# Patient Record
Sex: Female | Born: 1975 | Race: White | Hispanic: No | Smoking: Former smoker
Health system: Southern US, Community
[De-identification: ages and names within clinical notes are randomized; demographics above are authoritative.]

## PROBLEM LIST (undated history)

## (undated) DIAGNOSIS — N390 Urinary tract infection, site not specified: Secondary | ICD-10-CM

## (undated) DIAGNOSIS — K8020 Calculus of gallbladder without cholecystitis without obstruction: Secondary | ICD-10-CM

## (undated) DIAGNOSIS — F419 Anxiety disorder, unspecified: Secondary | ICD-10-CM

## (undated) DIAGNOSIS — I493 Ventricular premature depolarization: Secondary | ICD-10-CM

## (undated) DIAGNOSIS — E119 Type 2 diabetes mellitus without complications: Secondary | ICD-10-CM

## (undated) DIAGNOSIS — B019 Varicella without complication: Secondary | ICD-10-CM

## (undated) DIAGNOSIS — M47814 Spondylosis without myelopathy or radiculopathy, thoracic region: Secondary | ICD-10-CM

## (undated) DIAGNOSIS — F1010 Alcohol abuse, uncomplicated: Secondary | ICD-10-CM

## (undated) DIAGNOSIS — D734 Cyst of spleen: Secondary | ICD-10-CM

## (undated) DIAGNOSIS — F17200 Nicotine dependence, unspecified, uncomplicated: Secondary | ICD-10-CM

## (undated) HISTORY — DX: Spondylosis without myelopathy or radiculopathy, thoracic region: M47.814

## (undated) HISTORY — DX: Nicotine dependence, unspecified, uncomplicated: F17.200

## (undated) HISTORY — DX: Alcohol abuse, uncomplicated: F10.10

## (undated) HISTORY — DX: Urinary tract infection, site not specified: N39.0

## (undated) HISTORY — DX: Anxiety disorder, unspecified: F41.9

## (undated) HISTORY — DX: Type 2 diabetes mellitus without complications: E11.9

## (undated) HISTORY — PX: SKIN SURGERY: SHX2413

## (undated) HISTORY — DX: Varicella without complication: B01.9

## (undated) HISTORY — PX: APPENDECTOMY: SHX54

## (undated) HISTORY — PX: OTHER SURGICAL HISTORY: SHX169

## (undated) HISTORY — DX: Ventricular premature depolarization: I49.3

## (undated) HISTORY — DX: Calculus of gallbladder without cholecystitis without obstruction: K80.20

## (undated) HISTORY — DX: Cyst of spleen: D73.4

---

## 2011-10-03 ENCOUNTER — Emergency Department: Payer: Self-pay | Admitting: Emergency Medicine

## 2011-10-03 LAB — BASIC METABOLIC PANEL
Anion Gap: 10 (ref 7–16)
BUN: 7 mg/dL (ref 7–18)
Chloride: 105 mmol/L (ref 98–107)
Co2: 27 mmol/L (ref 21–32)
Creatinine: 0.61 mg/dL (ref 0.60–1.30)
EGFR (Non-African Amer.): >60
Osmolality: 280 (ref 275–301)
Sodium: 142 mmol/L (ref 136–145)

## 2011-10-03 LAB — CBC WITH DIFFERENTIAL/PLATELET
Basophil #: 0 10*3/uL (ref 0.0–0.1)
Basophil %: 0.1 %
Eosinophil #: 0.1 10*3/uL (ref 0.0–0.7)
Eosinophil %: 0.5 %
HCT: 41.5 % (ref 35.0–47.0)
HGB: 14 g/dL (ref 12.0–16.0)
Lymphocyte #: 2.1 10*3/uL (ref 1.0–3.6)
Lymphocyte %: 15.4 %
MCH: 32 pg (ref 26.0–34.0)
Monocyte #: 0.9 10*3/uL — ABNORMAL HIGH (ref 0.0–0.7)
Neutrophil #: 10.6 10*3/uL — ABNORMAL HIGH (ref 1.4–6.5)
Neutrophil %: 77.5 %
RBC: 4.39 10*6/uL (ref 3.80–5.20)

## 2011-10-03 LAB — PREGNANCY, URINE: Pregnancy Test, Urine: NEGATIVE m[IU]/mL

## 2011-10-03 LAB — MONONUCLEOSIS SCREEN: Mono Test: NEGATIVE

## 2012-08-07 ENCOUNTER — Emergency Department: Payer: Self-pay | Admitting: Emergency Medicine

## 2012-08-07 LAB — CBC WITH DIFFERENTIAL/PLATELET
Basophil #: 0.1 10*3/uL (ref 0.0–0.1)
Eosinophil #: 0.2 10*3/uL (ref 0.0–0.7)
HCT: 36.4 % (ref 35.0–47.0)
Lymphocyte %: 13.1 %
MCHC: 33.8 g/dL (ref 32.0–36.0)
Neutrophil #: 10.3 10*3/uL — ABNORMAL HIGH (ref 1.4–6.5)
Neutrophil %: 77.9 %
Platelet: 239 10*3/uL (ref 150–440)
RDW: 13 % (ref 11.5–14.5)

## 2012-08-07 LAB — BASIC METABOLIC PANEL
Chloride: 109 mmol/L — ABNORMAL HIGH (ref 98–107)
Creatinine: 0.49 mg/dL — ABNORMAL LOW (ref 0.60–1.30)
EGFR (Non-African Amer.): >60
Glucose: 101 mg/dL — ABNORMAL HIGH (ref 65–99)
Osmolality: 276 (ref 275–301)
Potassium: 4.9 mmol/L (ref 3.5–5.1)
Sodium: 139 mmol/L (ref 136–145)

## 2012-08-07 LAB — HCG, QUANTITATIVE, PREGNANCY: Beta Hcg, Quant.: 2418 m[IU]/mL — ABNORMAL HIGH

## 2012-08-07 LAB — HEPATIC FUNCTION PANEL A (ARMC)
Albumin: 3.8 g/dL (ref 3.4–5.0)
Alkaline Phosphatase: 62 U/L (ref 50–136)
SGOT(AST): 41 U/L — ABNORMAL HIGH (ref 15–37)
SGPT (ALT): 28 U/L (ref 12–78)
Total Protein: 7.3 g/dL (ref 6.4–8.2)

## 2012-08-07 LAB — URINALYSIS, COMPLETE
Nitrite: NEGATIVE
Ph: 6 (ref 4.5–8.0)
Protein: NEGATIVE
Specific Gravity: 1.02 (ref 1.003–1.030)

## 2012-08-07 LAB — LIPASE, BLOOD: Lipase: 124 U/L (ref 73–393)

## 2014-08-17 LAB — HM PAP SMEAR: HM Pap smear: NEGATIVE

## 2014-09-26 ENCOUNTER — Emergency Department: Payer: Self-pay | Admitting: Emergency Medicine

## 2014-10-08 ENCOUNTER — Emergency Department: Payer: Self-pay | Admitting: Emergency Medicine

## 2014-10-24 DIAGNOSIS — F17200 Nicotine dependence, unspecified, uncomplicated: Secondary | ICD-10-CM | POA: Insufficient documentation

## 2014-10-24 DIAGNOSIS — Z8619 Personal history of other infectious and parasitic diseases: Secondary | ICD-10-CM | POA: Insufficient documentation

## 2014-11-28 DIAGNOSIS — F4329 Adjustment disorder with other symptoms: Secondary | ICD-10-CM | POA: Insufficient documentation

## 2015-02-20 DIAGNOSIS — F1911 Other psychoactive substance abuse, in remission: Secondary | ICD-10-CM | POA: Insufficient documentation

## 2015-02-20 DIAGNOSIS — O09292 Supervision of pregnancy with other poor reproductive or obstetric history, second trimester: Secondary | ICD-10-CM | POA: Insufficient documentation

## 2015-03-11 DIAGNOSIS — D649 Anemia, unspecified: Secondary | ICD-10-CM | POA: Insufficient documentation

## 2016-08-22 ENCOUNTER — Emergency Department: Payer: Managed Care, Other (non HMO)

## 2016-08-22 ENCOUNTER — Encounter: Payer: Self-pay | Admitting: Emergency Medicine

## 2016-08-22 ENCOUNTER — Observation Stay
Admission: EM | Admit: 2016-08-22 | Discharge: 2016-08-24 | Disposition: A | Discharge status: Home / Self Care | Payer: Managed Care, Other (non HMO) | Attending: Surgery | Admitting: Surgery

## 2016-08-22 DIAGNOSIS — K811 Chronic cholecystitis: Principal | ICD-10-CM | POA: Insufficient documentation

## 2016-08-22 DIAGNOSIS — R079 Chest pain, unspecified: Secondary | ICD-10-CM

## 2016-08-22 DIAGNOSIS — K819 Cholecystitis, unspecified: Secondary | ICD-10-CM

## 2016-08-22 DIAGNOSIS — R1013 Epigastric pain: Secondary | ICD-10-CM

## 2016-08-22 DIAGNOSIS — F17210 Nicotine dependence, cigarettes, uncomplicated: Secondary | ICD-10-CM | POA: Insufficient documentation

## 2016-08-22 LAB — CBC
HEMATOCRIT: 40.9 % (ref 35.0–47.0)
Hemoglobin: 14.1 g/dL (ref 12.0–16.0)
MCH: 32.4 pg (ref 26.0–34.0)
MCHC: 34.4 g/dL (ref 32.0–36.0)
MCV: 94.3 fL (ref 80.0–100.0)
Platelets: 211 10*3/uL (ref 150–440)
RBC: 4.34 MIL/uL (ref 3.80–5.20)
RDW: 12.7 % (ref 11.5–14.5)
WBC: 8.6 10*3/uL (ref 3.6–11.0)

## 2016-08-22 LAB — HEPATIC FUNCTION PANEL
ALBUMIN: 4.2 g/dL (ref 3.5–5.0)
ALK PHOS: 35 U/L — AB (ref 38–126)
ALT: 16 U/L (ref 14–54)
AST: 19 U/L (ref 15–41)
BILIRUBIN INDIRECT: 0.8 mg/dL (ref 0.3–0.9)
Bilirubin, Direct: 0.1 mg/dL (ref 0.1–0.5)
Total Bilirubin: 0.9 mg/dL (ref 0.3–1.2)
Total Protein: 6.9 g/dL (ref 6.5–8.1)

## 2016-08-22 LAB — BASIC METABOLIC PANEL
ANION GAP: 4 — AB (ref 5–15)
BUN: 16 mg/dL (ref 6–20)
CHLORIDE: 109 mmol/L (ref 101–111)
CO2: 25 mmol/L (ref 22–32)
Calcium: 8.9 mg/dL (ref 8.9–10.3)
Creatinine, Ser: 0.87 mg/dL (ref 0.44–1.00)
GFR calc Af Amer: >60 mL/min (ref >60)
Glucose, Bld: 97 mg/dL (ref 65–99)
POTASSIUM: 4.1 mmol/L (ref 3.5–5.1)
SODIUM: 138 mmol/L (ref 135–145)

## 2016-08-22 LAB — LIPASE, BLOOD: LIPASE: 60 U/L — AB (ref 11–51)

## 2016-08-22 LAB — TROPONIN I: Troponin I: <0.03 ng/mL (ref <0.03)

## 2016-08-22 MED ORDER — ONDANSETRON HCL 4 MG/2ML IJ SOLN
INTRAMUSCULAR | Status: AC
  Administered 2016-08-22: 4 mg
  Filled 2016-08-22: qty 2

## 2016-08-22 MED ORDER — SODIUM CHLORIDE 0.9 % IV BOLUS (SEPSIS)
1000.0000 mL | Freq: Once | INTRAVENOUS | Status: AC
  Administered 2016-08-22: 1000 mL via INTRAVENOUS

## 2016-08-22 MED ORDER — FAMOTIDINE IN NACL 20-0.9 MG/50ML-% IV SOLN
20.0000 mg | Freq: Once | INTRAVENOUS | Status: AC
  Administered 2016-08-22: 20 mg via INTRAVENOUS
  Filled 2016-08-22: qty 50

## 2016-08-22 MED ORDER — IOPAMIDOL (ISOVUE-370) INJECTION 76%
75.0000 mL | Freq: Once | INTRAVENOUS | Status: AC | PRN
Start: 2016-08-22 — End: 2016-08-22
  Administered 2016-08-22: 75 mL via INTRAVENOUS

## 2016-08-22 MED ORDER — MORPHINE SULFATE (PF) 4 MG/ML IV SOLN
INTRAVENOUS | Status: AC
  Administered 2016-08-22: 4 mg
  Filled 2016-08-22: qty 1

## 2016-08-22 NOTE — ED Triage Notes (Signed)
Pt says she was laying down with her son in the last hour and began having pain to the center of her chest radiating through to her back; constant pain, "like a tight";  nausea just started, no vomiting; pt denies history of similar pain; talking in complete coherent sentences;

## 2016-08-22 NOTE — ED Notes (Signed)
Pt taken to ct 

## 2016-08-22 NOTE — ED Provider Notes (Signed)
[facility] Emergency Department Provider Note   ____________________________________________   First MD Initiated Contact with Patient 08/22/16 2323     (approximate)  I have reviewed the triage vital signs and the nursing notes.   HISTORY  Chief Complaint Chest Pain and Nausea    HPI Erica Mejia is a 41 y.o. female who presents to the ED from home with a chief complaint of upper abdominal/lower chest pain. Patient reports sudden onset of substernal chest pain radiating to her back approximately 1 hour ago. Describes tight, constant pain associated with nausea only. Denies diaphoresis, shortness of breath, vomiting, palpitations or dizziness. Denies history of similar pain. Denies recent fever, chills, dysuria, diarrhea. Denies recent travel or trauma. Has IUD in place. Nothing makes her symptoms better or worse.   Past medical history None  There are no active problems to display for this patient.   Past Surgical History:  Procedure Laterality Date  . APPENDECTOMY      Prior to Admission medications   Medication Sig Start Date End Date Taking? Authorizing Provider  levonorgestrel (MIRENA) 20 MCG/24HR IUD 1 each by Intrauterine route once.   Yes Historical Provider, MD    Allergies Patient has no known allergies.  History reviewed. No pertinent family history.  Social History Social History  Substance Use Topics  . Smoking status: Current Every Day Smoker  . Smokeless tobacco: Never Used  . Alcohol use No    Review of Systems  Constitutional: No fever/chills. Eyes: No visual changes. ENT: No sore throat. Cardiovascular: Positive for chest pain. Respiratory: Denies shortness of breath. Gastrointestinal: Positive for abdominal pain.  Positive for nausea, no vomiting.  No diarrhea.  No constipation. Genitourinary: Negative for dysuria. Musculoskeletal: Negative for back pain. Skin: Negative for rash. Neurological: Negative for  headaches, focal weakness or numbness.  10-point ROS otherwise negative.  ____________________________________________   PHYSICAL EXAM:  VITAL SIGNS: ED Triage Vitals [08/22/16 2309]  Enc Vitals Group     BP      Pulse      Resp      Temp      Temp src      SpO2      Weight 175 lb (79.4 kg)     Height 5\' 5"  (1.651 m)     Head Circumference      Peak Flow      Pain Score 8     Pain Loc      Pain Edu?      Excl. in GC?     Constitutional: Alert and oriented. Well appearing and in moderate acute distress. Eyes: Conjunctivae are normal. PERRL. EOMI. Head: Atraumatic. Nose: No congestion/rhinnorhea. Mouth/Throat: Mucous membranes are moist.  Oropharynx non-erythematous. Neck: No stridor.   Cardiovascular: Normal rate, regular rhythm. Grossly normal heart sounds.  Good peripheral circulation. Respiratory: Normal respiratory effort.  No retractions. Lungs CTAB. Gastrointestinal: Soft and tender to palpation epigastrium with voluntary guarding, no rebound. No distention. No abdominal bruits. No CVA tenderness. Musculoskeletal: No lower extremity tenderness nor edema.  No joint effusions. Neurologic:  Normal speech and language. No gross focal neurologic deficits are appreciated. No gait instability. Skin:  Skin is warm, dry and intact. No rash noted. Psychiatric: Mood and affect are normal. Speech and behavior are normal.  ____________________________________________   LABS (all labs ordered are listed, but only abnormal results are displayed)  Labs Reviewed  BASIC METABOLIC PANEL - Abnormal; Notable for the following:       Result  Value   Anion gap 4 (*)    All other components within normal limits  LIPASE, BLOOD - Abnormal; Notable for the following:    Lipase 60 (*)    All other components within normal limits  HEPATIC FUNCTION PANEL - Abnormal; Notable for the following:    Alkaline Phosphatase 35 (*)    All other components within normal limits  CBC  TROPONIN I    ____________________________________________  EKG  ED ECG REPORT I, SUNG,JADE J, the attending physician, personally viewed and interpreted this ECG.   Date: 08/22/2016  EKG Time: 2311  Rate: 75  Rhythm: normal EKG, normal sinus rhythm  Axis: Normal  Intervals:none  ST&T Change: Nonspecific  ____________________________________________  RADIOLOGY  Portable chest x-ray (viewed by me, interpreted per Dr. Margarita Grizzle): No edema or consolidation.  CTA chest interpreted per Dr. Harrie Jeans: 1. No pulmonary embolus identified.  2. No acute pulmonary process.  3. Partially visualized cystic lesion within the pancreas measuring  up to 6.5 cm. The visualized portion of the lesion has benign  features. Complete evaluation with abdominal ultrasound or CT/MRI is  recommended.   Ultrasound abdomen RUQ interpreted per Dr. Cherly Hensen: Cholelithiasis, with gallbladder wall thickening and trace  pericholecystic fluid. Positive ultrasonographic Murphy's sign  elicited. This is concerning for mild acute cholecystitis.   ____________________________________________   PROCEDURES  Procedure(s) performed: None  Procedures  Critical Care performed:   CRITICAL CARE Performed by: Irean Hong   Total critical care time: 30 minutes  Critical care time was exclusive of separately billable procedures and treating other patients.  Critical care was necessary to treat or prevent imminent or life-threatening deterioration.  Critical care was time spent personally by me on the following activities: development of treatment plan with patient and/or surrogate as well as nursing, discussions with consultants, evaluation of patient's response to treatment, examination of patient, obtaining history from patient or surrogate, ordering and performing treatments and interventions, ordering and review of laboratory studies, ordering and review of radiographic studies, pulse oximetry and re-evaluation of  patient's condition.  ____________________________________________   INITIAL IMPRESSION / ASSESSMENT AND PLAN / ED COURSE  Pertinent labs & imaging results that were available during my care of the patient were reviewed by me and considered in my medical decision making (see chart for details).  41 year old female who presents with upper epigastric/substernal chest pain radiating to her back. She ambulated from the lobby back to her treatment room and had an increase of her pain. No acute changes on EKG. Will administer IV analgesics, start with CT chest to evaluate for PE/dissection. Will check LFTs, lipase and consider ultrasound of her gallbladder. She ate spicy beans for dinner; will administer IV Pepcid.  Clinical Course as of Aug 23 650  Mon Aug 23, 2016  0028 Updated patient of CT results. Pain returning. Will administer IV Dilaudid for better pain control.  [JS]  0136 Patient is resting comfortably, visiting with her mother. Awaiting ultrasound.  [JS]  0408 Patient required another dose of Dilaudid prior to ultrasound as her pain returned. Updated her of ultrasound results concerning for acute cholecystitis. Will speak with general surgery to evaluate patient in the emergency department.  [JS]    Clinical Course User Index [JS] Irean Hong, MD     ____________________________________________   FINAL CLINICAL IMPRESSION(S) / ED DIAGNOSES  Final diagnoses:  Epigastric pain  Cholecystitis      NEW MEDICATIONS STARTED DURING THIS VISIT:  New Prescriptions   No medications on file  Note:  This document was prepared using Dragon voice recognition software and may include unintentional dictation errors.    Irean HongJade J Sung, MD 08/23/16 [phone] [id number]

## 2016-08-23 ENCOUNTER — Emergency Department: Payer: Managed Care, Other (non HMO)

## 2016-08-23 ENCOUNTER — Observation Stay: Payer: Managed Care, Other (non HMO) | Admitting: Certified Registered"

## 2016-08-23 ENCOUNTER — Encounter
Admission: EM | Disposition: A | Discharge status: Home / Self Care | Payer: Self-pay | Source: Home / Self Care | Attending: Emergency Medicine

## 2016-08-23 DIAGNOSIS — K819 Cholecystitis, unspecified: Secondary | ICD-10-CM | POA: Diagnosis present

## 2016-08-23 DIAGNOSIS — K8018 Calculus of gallbladder with other cholecystitis without obstruction: Secondary | ICD-10-CM

## 2016-08-23 HISTORY — PX: CHOLECYSTECTOMY: SHX55

## 2016-08-23 LAB — URINE DRUG SCREEN, QUALITATIVE (ARMC ONLY)
AMPHETAMINES, UR SCREEN: NOT DETECTED
Barbiturates, Ur Screen: NOT DETECTED
Benzodiazepine, Ur Scrn: NOT DETECTED
COCAINE METABOLITE, UR ~~LOC~~: NOT DETECTED
Cannabinoid 50 Ng, Ur ~~LOC~~: POSITIVE — AB
MDMA (ECSTASY) UR SCREEN: NOT DETECTED
Methadone Scn, Ur: NOT DETECTED
Opiate, Ur Screen: POSITIVE — AB
Phencyclidine (PCP) Ur S: NOT DETECTED
TRICYCLIC, UR SCREEN: NOT DETECTED

## 2016-08-23 LAB — PREGNANCY, URINE: PREG TEST UR: NEGATIVE

## 2016-08-23 LAB — CREATININE, SERUM
Creatinine, Ser: 0.52 mg/dL (ref 0.44–1.00)
GFR calc non Af Amer: >60 mL/min (ref >60)

## 2016-08-23 LAB — CBC
HEMATOCRIT: 36.9 % (ref 35.0–47.0)
Hemoglobin: 12.9 g/dL (ref 12.0–16.0)
MCH: 32.7 pg (ref 26.0–34.0)
MCHC: 35 g/dL (ref 32.0–36.0)
MCV: 93.6 fL (ref 80.0–100.0)
Platelets: 201 10*3/uL (ref 150–440)
RBC: 3.94 MIL/uL (ref 3.80–5.20)
RDW: 13.2 % (ref 11.5–14.5)
WBC: 8.4 10*3/uL (ref 3.6–11.0)

## 2016-08-23 LAB — SURGICAL PCR SCREEN
MRSA, PCR: NEGATIVE
Staphylococcus aureus: NEGATIVE

## 2016-08-23 SURGERY — LAPAROSCOPIC CHOLECYSTECTOMY
Anesthesia: General | Wound class: Clean Contaminated

## 2016-08-23 MED ORDER — FENTANYL CITRATE (PF) 100 MCG/2ML IJ SOLN
INTRAMUSCULAR | Status: AC
  Filled 2016-08-23: qty 2

## 2016-08-23 MED ORDER — KETOROLAC TROMETHAMINE 30 MG/ML IJ SOLN
INTRAMUSCULAR | Status: DC | PRN
  Administered 2016-08-23: 30 mg via INTRAVENOUS

## 2016-08-23 MED ORDER — CHLORHEXIDINE GLUCONATE CLOTH 2 % EX PADS
6.0000 | MEDICATED_PAD | Freq: Once | CUTANEOUS | Status: AC
  Administered 2016-08-23: 6 via TOPICAL

## 2016-08-23 MED ORDER — DEXAMETHASONE SODIUM PHOSPHATE 10 MG/ML IJ SOLN
INTRAMUSCULAR | Status: DC | PRN
  Administered 2016-08-23: 4 mg via INTRAVENOUS

## 2016-08-23 MED ORDER — ONDANSETRON HCL 4 MG/2ML IJ SOLN
INTRAMUSCULAR | Status: DC | PRN
  Administered 2016-08-23: 4 mg via INTRAVENOUS

## 2016-08-23 MED ORDER — PROPOFOL 10 MG/ML IV BOLUS
INTRAVENOUS | Status: DC | PRN
  Administered 2016-08-23: 150 mg via INTRAVENOUS

## 2016-08-23 MED ORDER — ACETAMINOPHEN 10 MG/ML IV SOLN
INTRAVENOUS | Status: AC
  Filled 2016-08-23: qty 100

## 2016-08-23 MED ORDER — CEFTRIAXONE SODIUM-DEXTROSE 2-2.22 GM-% IV SOLR
2.0000 g | INTRAVENOUS | Status: DC
  Administered 2016-08-23 – 2016-08-24 (×2): 2 g via INTRAVENOUS
  Filled 2016-08-23 (×2): qty 50

## 2016-08-23 MED ORDER — ROCURONIUM BROMIDE 100 MG/10ML IV SOLN
INTRAVENOUS | Status: DC | PRN
  Administered 2016-08-23: 50 mg via INTRAVENOUS
  Administered 2016-08-23: 10 mg via INTRAVENOUS

## 2016-08-23 MED ORDER — ONDANSETRON HCL 4 MG/2ML IJ SOLN
INTRAMUSCULAR | Status: AC
  Filled 2016-08-23: qty 2

## 2016-08-23 MED ORDER — NICOTINE 14 MG/24HR TD PT24
14.0000 mg | MEDICATED_PATCH | Freq: Every day | TRANSDERMAL | Status: DC
  Filled 2016-08-23: qty 1

## 2016-08-23 MED ORDER — HYDROMORPHONE HCL 1 MG/ML IJ SOLN
1.0000 mg | INTRAMUSCULAR | Status: DC | PRN
  Administered 2016-08-23 (×4): 1 mg via INTRAVENOUS
  Filled 2016-08-23 (×4): qty 1

## 2016-08-23 MED ORDER — OXYCODONE HCL 5 MG PO TABS: 5.0000 mg | ORAL_TABLET | Freq: Once | ORAL | Status: DC | PRN

## 2016-08-23 MED ORDER — PROPOFOL 10 MG/ML IV BOLUS
INTRAVENOUS | Status: AC
Start: 2016-08-23 — End: 2016-08-23
  Filled 2016-08-23: qty 20

## 2016-08-23 MED ORDER — KETOROLAC TROMETHAMINE 30 MG/ML IJ SOLN
INTRAMUSCULAR | Status: AC
  Filled 2016-08-23: qty 1

## 2016-08-23 MED ORDER — MIDAZOLAM HCL 2 MG/2ML IJ SOLN
INTRAMUSCULAR | Status: AC
Start: 2016-08-23 — End: 2016-08-23
  Filled 2016-08-23: qty 2

## 2016-08-23 MED ORDER — LIDOCAINE 2% (20 MG/ML) 5 ML SYRINGE
INTRAMUSCULAR | Status: AC
  Filled 2016-08-23: qty 5

## 2016-08-23 MED ORDER — ROCURONIUM BROMIDE 50 MG/5ML IV SOSY
PREFILLED_SYRINGE | INTRAVENOUS | Status: AC
  Filled 2016-08-23: qty 5

## 2016-08-23 MED ORDER — OXYCODONE HCL 5 MG/5ML PO SOLN: 5.0000 mg | Freq: Once | ORAL | Status: DC | PRN

## 2016-08-23 MED ORDER — ONDANSETRON HCL 4 MG/2ML IJ SOLN
4.0000 mg | Freq: Once | INTRAMUSCULAR | Status: AC
  Administered 2016-08-23: 4 mg via INTRAVENOUS
  Filled 2016-08-23: qty 2

## 2016-08-23 MED ORDER — SUGAMMADEX SODIUM 200 MG/2ML IV SOLN
INTRAVENOUS | Status: DC | PRN
  Administered 2016-08-23: 160 mg via INTRAVENOUS

## 2016-08-23 MED ORDER — ONDANSETRON HCL 4 MG/2ML IJ SOLN: 4.0000 mg | Freq: Four times a day (QID) | INTRAMUSCULAR | Status: DC | PRN

## 2016-08-23 MED ORDER — ENOXAPARIN SODIUM 40 MG/0.4ML ~~LOC~~ SOLN
40.0000 mg | SUBCUTANEOUS | Status: DC
  Administered 2016-08-23: 40 mg via SUBCUTANEOUS
  Filled 2016-08-23: qty 0.4

## 2016-08-23 MED ORDER — CHLORHEXIDINE GLUCONATE CLOTH 2 % EX PADS: 6.0000 | MEDICATED_PAD | Freq: Once | CUTANEOUS | Status: DC

## 2016-08-23 MED ORDER — LIDOCAINE HCL (CARDIAC) 20 MG/ML IV SOLN
INTRAVENOUS | Status: DC | PRN
  Administered 2016-08-23: 60 mg via INTRAVENOUS

## 2016-08-23 MED ORDER — ACETAMINOPHEN 650 MG RE SUPP: 650.0000 mg | Freq: Four times a day (QID) | RECTAL | Status: DC | PRN

## 2016-08-23 MED ORDER — DEXTROSE 5 % IV SOLN: 2.0000 g | INTRAVENOUS | Status: DC

## 2016-08-23 MED ORDER — ACETAMINOPHEN 10 MG/ML IV SOLN
INTRAVENOUS | Status: DC | PRN
  Administered 2016-08-23: 1000 mg via INTRAVENOUS

## 2016-08-23 MED ORDER — MEPERIDINE HCL 25 MG/ML IJ SOLN: 6.2500 mg | INTRAMUSCULAR | Status: DC | PRN

## 2016-08-23 MED ORDER — ACETAMINOPHEN 325 MG PO TABS: 650.0000 mg | ORAL_TABLET | Freq: Four times a day (QID) | ORAL | Status: DC | PRN

## 2016-08-23 MED ORDER — ONDANSETRON 4 MG PO TBDP: 4.0000 mg | ORAL_TABLET | Freq: Four times a day (QID) | ORAL | Status: DC | PRN

## 2016-08-23 MED ORDER — MIDAZOLAM HCL 2 MG/2ML IJ SOLN
INTRAMUSCULAR | Status: DC | PRN
  Administered 2016-08-23: 2 mg via INTRAVENOUS

## 2016-08-23 MED ORDER — PROMETHAZINE HCL 25 MG/ML IJ SOLN
12.5000 mg | Freq: Once | INTRAMUSCULAR | Status: AC
  Administered 2016-08-23: 12.5 mg via INTRAVENOUS
  Filled 2016-08-23: qty 1

## 2016-08-23 MED ORDER — LACTATED RINGERS IV SOLN
INTRAVENOUS | Status: DC
  Administered 2016-08-23 (×2): via INTRAVENOUS

## 2016-08-23 MED ORDER — BUPIVACAINE-EPINEPHRINE (PF) 0.25% -1:200000 IJ SOLN
INTRAMUSCULAR | Status: AC
  Filled 2016-08-23: qty 30

## 2016-08-23 MED ORDER — PANTOPRAZOLE SODIUM 40 MG PO TBEC
40.0000 mg | DELAYED_RELEASE_TABLET | Freq: Every day | ORAL | Status: DC
  Administered 2016-08-23: 40 mg via ORAL
  Filled 2016-08-23: qty 1

## 2016-08-23 MED ORDER — HYDROMORPHONE HCL 1 MG/ML IJ SOLN
0.5000 mg | Freq: Once | INTRAMUSCULAR | Status: AC
Start: 2016-08-23 — End: 2016-08-23
  Administered 2016-08-23: 0.5 mg via INTRAVENOUS
  Filled 2016-08-23: qty 1

## 2016-08-23 MED ORDER — PROMETHAZINE HCL 25 MG/ML IJ SOLN: 6.2500 mg | INTRAMUSCULAR | Status: DC | PRN

## 2016-08-23 MED ORDER — FENTANYL CITRATE (PF) 100 MCG/2ML IJ SOLN
INTRAMUSCULAR | Status: DC | PRN
  Administered 2016-08-23 (×3): 50 ug via INTRAVENOUS

## 2016-08-23 MED ORDER — DEXAMETHASONE SODIUM PHOSPHATE 10 MG/ML IJ SOLN
INTRAMUSCULAR | Status: AC
  Filled 2016-08-23: qty 1

## 2016-08-23 MED ORDER — KETOROLAC TROMETHAMINE 30 MG/ML IJ SOLN
30.0000 mg | Freq: Four times a day (QID) | INTRAMUSCULAR | Status: DC
  Administered 2016-08-23 – 2016-08-24 (×3): 30 mg via INTRAVENOUS
  Filled 2016-08-23 (×3): qty 1

## 2016-08-23 MED ORDER — HYDROMORPHONE HCL 1 MG/ML IJ SOLN
0.5000 mg | Freq: Once | INTRAMUSCULAR | Status: AC
  Administered 2016-08-23: 0.5 mg via INTRAVENOUS
  Filled 2016-08-23: qty 1

## 2016-08-23 MED ORDER — FENTANYL CITRATE (PF) 100 MCG/2ML IJ SOLN: 25.0000 ug | INTRAMUSCULAR | Status: DC | PRN

## 2016-08-23 MED ORDER — BUPIVACAINE-EPINEPHRINE 0.25% -1:200000 IJ SOLN
INTRAMUSCULAR | Status: DC | PRN
  Administered 2016-08-23: 30 mL

## 2016-08-23 MED ORDER — KCL IN DEXTROSE-NACL 20-5-0.45 MEQ/L-%-% IV SOLN
INTRAVENOUS | Status: DC
Start: 2016-08-23 — End: 2016-08-23
  Administered 2016-08-23: 10:00:00 via INTRAVENOUS
  Filled 2016-08-23 (×3): qty 1000

## 2016-08-23 MED ORDER — HYDROCODONE-ACETAMINOPHEN 5-325 MG PO TABS
1.0000 | ORAL_TABLET | ORAL | Status: DC | PRN
  Administered 2016-08-23: 1 via ORAL
  Administered 2016-08-24: 2 via ORAL
  Filled 2016-08-23: qty 1
  Filled 2016-08-23: qty 2

## 2016-08-23 MED ORDER — SUGAMMADEX SODIUM 200 MG/2ML IV SOLN
INTRAVENOUS | Status: AC
  Filled 2016-08-23: qty 2

## 2016-08-23 SURGICAL SUPPLY — 52 items
APPLICATOR COTTON TIP 6IN STRL (MISCELLANEOUS) IMPLANT
APPLIER CLIP 5 13 M/L LIGAMAX5 (MISCELLANEOUS) ×3
BLADE SURG 15 STRL LF DISP TIS (BLADE) ×1 IMPLANT
BLADE SURG 15 STRL SS (BLADE) ×2
CANISTER SUCT 1200ML W/VALVE (MISCELLANEOUS) ×3 IMPLANT
CHLORAPREP W/TINT 26ML (MISCELLANEOUS) ×3 IMPLANT
CHOLANGIOGRAM CATH TAUT (CATHETERS) IMPLANT
CLEANER CAUTERY TIP 5X5 PAD (MISCELLANEOUS) ×1 IMPLANT
CLIP APPLIE 5 13 M/L LIGAMAX5 (MISCELLANEOUS) ×1 IMPLANT
DECANTER SPIKE VIAL GLASS SM (MISCELLANEOUS) IMPLANT
DERMABOND ADVANCED (GAUZE/BANDAGES/DRESSINGS) ×2
DERMABOND ADVANCED .7 DNX12 (GAUZE/BANDAGES/DRESSINGS) ×1 IMPLANT
DEVICE TROCAR PUNCTURE CLOSURE (ENDOMECHANICALS) IMPLANT
DRAPE C-ARM XRAY 36X54 (DRAPES) IMPLANT
ELECT CAUTERY BLADE 6.4 (BLADE) ×3 IMPLANT
ELECT REM PT RETURN 9FT ADLT (ELECTROSURGICAL) ×3
ELECTRODE REM PT RTRN 9FT ADLT (ELECTROSURGICAL) ×1 IMPLANT
ENDOPOUCH RETRIEVER 10 (MISCELLANEOUS) ×3 IMPLANT
GLOVE BIO SURGEON STRL SZ7 (GLOVE) ×3 IMPLANT
GLOVE BIOGEL PI IND STRL 7.0 (GLOVE) ×4 IMPLANT
GLOVE BIOGEL PI INDICATOR 7.0 (GLOVE) ×8
GOWN STRL REUS W/ TWL LRG LVL3 (GOWN DISPOSABLE) ×3 IMPLANT
GOWN STRL REUS W/TWL LRG LVL3 (GOWN DISPOSABLE) ×6
IRRIGATION STRYKERFLOW (MISCELLANEOUS) IMPLANT
IRRIGATOR STRYKERFLOW (MISCELLANEOUS)
IV CATH ANGIO 12GX3 LT BLUE (NEEDLE) IMPLANT
IV NS 1000ML (IV SOLUTION) ×2
IV NS 1000ML BAXH (IV SOLUTION) ×1 IMPLANT
L-HOOK LAP DISP 36CM (ELECTROSURGICAL) ×3
LHOOK LAP DISP 36CM (ELECTROSURGICAL) ×1 IMPLANT
LIQUID BAND (GAUZE/BANDAGES/DRESSINGS) IMPLANT
NEEDLE HYPO 22GX1.5 SAFETY (NEEDLE) ×3 IMPLANT
NEEDLE HYPO 25X1 1.5 SAFETY (NEEDLE) ×3 IMPLANT
PACK LAP CHOLECYSTECTOMY (MISCELLANEOUS) ×3 IMPLANT
PAD CLEANER CAUTERY TIP 5X5 (MISCELLANEOUS) ×2
PENCIL ELECTRO HAND CTR (MISCELLANEOUS) ×3 IMPLANT
SCISSORS METZENBAUM CVD 33 (INSTRUMENTS) ×3 IMPLANT
SET SUCTION IRRIG HYDROSURG (IRRIGATION / IRRIGATOR) ×3 IMPLANT
SLEEVE ENDOPATH XCEL 5M (ENDOMECHANICALS) ×6 IMPLANT
SOL ANTI-FOG 6CC FOG-OUT (MISCELLANEOUS) ×1 IMPLANT
SOL FOG-OUT ANTI-FOG 6CC (MISCELLANEOUS) ×2
SPONGE LAP 18X18 5 PK (GAUZE/BANDAGES/DRESSINGS) ×3 IMPLANT
STOPCOCK 3 WAY  REPLAC (MISCELLANEOUS) IMPLANT
SUT ETHIBOND 0 MO6 C/R (SUTURE) IMPLANT
SUT MNCRL AB 4-0 PS2 18 (SUTURE) ×6 IMPLANT
SUT VIC AB 0 CT2 27 (SUTURE) ×6 IMPLANT
SUT VICRYL 0 AB UR-6 (SUTURE) IMPLANT
SYR 20CC LL (SYRINGE) ×3 IMPLANT
TROCAR XCEL BLUNT TIP 100MML (ENDOMECHANICALS) ×3 IMPLANT
TROCAR XCEL NON-BLD 5MMX100MML (ENDOMECHANICALS) ×3 IMPLANT
TUBING INSUFFLATOR HI FLOW (MISCELLANEOUS) ×3 IMPLANT
WATER STERILE IRR 1000ML POUR (IV SOLUTION) IMPLANT

## 2016-08-23 NOTE — Op Note (Signed)
Laparoscopic Cholecystectomy  Pre-operative Diagnosis: Cholecystitis  Post-operative Diagnosis: Same  Procedure: Laparoscopic cholecystectomy  Surgeon: Erica Mejia Erica Fairbank, MD FACS  Anesthesia: Gen. with endotracheal tube  Findings: Acute Cholecystitis   Estimated Blood Loss: 10cc         Drains: none         Specimens: Gallbladder           Complications: none   Procedure Details  The patient was seen again in the Holding Room. The benefits, complications, treatment options, and expected outcomes were discussed with the patient. The risks of bleeding, infection, recurrence of symptoms, failure to resolve symptoms, bile duct damage, bile duct leak, retained common bile duct stone, bowel injury, any of which could require further surgery and/or ERCP, stent, or papillotomy were reviewed with the patient. The likelihood of improving the patient's symptoms with return to their baseline status is good.  The patient and/or family concurred with the proposed plan, giving informed consent.  The patient was taken to Operating Room, identified as Erica Mejia and the procedure verified as Laparoscopic Cholecystectomy.  A Time Out was held and the above information confirmed.  Prior to the induction of general anesthesia, antibiotic prophylaxis was administered. VTE prophylaxis was in place. General endotracheal anesthesia was then administered and tolerated well. After the induction, the abdomen was prepped with Chloraprep and draped in the sterile fashion. The patient was positioned in the supine position.  Local anesthetic  was injected into the skin near the umbilicus and an incision made. Cut down technique was used to enter the abdominal cavity and a Hasson trochar was placed after two vicryl stitches were anchored to the fascia. Pneumoperitoneum was then created with CO2 and tolerated well without any adverse changes in the patient's vital signs.  Three 5-mm ports were placed in the right upper  quadrant all under direct vision. All skin incisions  were infiltrated with a local anesthetic agent before making the incision and placing the trocars.   The patient was positioned  in reverse Trendelenburg, tilted slightly to the patient's left.  The gallbladder was identified, the fundus grasped and retracted cephalad. Adhesions were lysed bluntly. The infundibulum was grasped and retracted laterally, exposing the peritoneum overlying the triangle of Calot. This was then divided and exposed in a blunt fashion. An extended critical view of the cystic duct and cystic artery was obtained.  The cystic duct was clearly identified and bluntly dissected.   Artery and duct were double clipped and divided.  The gallbladder was taken from the gallbladder fossa in a retrograde fashion with the electrocautery. The gallbladder was removed and placed in an Endocatch bag. The liver bed was irrigated and inspected. Hemostasis was achieved with the electrocautery. Copious irrigation was utilized and was repeatedly aspirated until clear.  The gallbladder and Endocatch sac were then removed through the umbilical port site.   Inspection of the right upper quadrant was performed. No bleeding, bile duct injury or leak, or bowel injury was noted. Pneumoperitoneum was released.  The periumbilical port site was closed with figure-of-eight 0 Vicryl sutures. 4-0 subcuticular Monocryl was used to close the skin. Dermabond was  applied.  The patient was then extubated and brought to the recovery room in stable condition. Sponge, lap, and needle counts were correct at closure and at the conclusion of the case.               Erica Mejia Erica Schriever, MD, FACS

## 2016-08-23 NOTE — H&P (Signed)
Erica Mejia is a 41 y.o. female  with a sudden onset of midepigastric substernal pain.  HPI: She was in her usual state of good health until this evening following her evening meal. She developed the sudden onset of substernal midepigastric abdominal pain radiating to her back and both upper quadrants. She denies any previous similar symptoms of any type. She's had no nausea vomiting indigestion heartburn previous abdominal pain bloating. She describes herself as having a "steel" stomach. The pain was so intense she presented to the emergency room for further evaluation. CT angios was performed which revealed no significant intrathoracic problems. However a large splenic mass was identified which appeared to be benign. She underwent ultrasound which demonstrated multiple layering gallstones mild gallbladder wall thickening and a positive sonographic Murphy sign. Surgical service was consulted.  She denies any history of hepatitis yellow jaundice pancreatitis peptic ulcer disease previous diagnosis of gallbladder disease or diverticulitis. Only previous surgery was an open appendectomy TH 11. She is 38-month-old son and was in the high-risk pregnancy group evaluation at [facility]. She has no cardiac disease hypertension diabetes or thyroid problems. She takes no medications regularly. She has no medical allergies. She is cigarette smoker.  History reviewed. No pertinent past medical history. Past Surgical History:  Procedure Laterality Date  . APPENDECTOMY     Social History   Social History  . Marital status: Divorced    Spouse name: N/A  . Number of children: N/A  . Years of education: N/A   Social History Main Topics  . Smoking status: Current Every Day Smoker  . Smokeless tobacco: Never Used  . Alcohol use No  . Drug use:     Types: Marijuana     Comment: last smoked today  . Sexual activity: Not Asked   Other Topics Concern  . None   Social History Narrative  . None     Review of  Systems  Constitutional: Positive for diaphoresis. Negative for chills and fever.  HENT: Negative.   Eyes: Negative.   Respiratory: Negative for cough, sputum production, shortness of breath and wheezing.   Cardiovascular: Positive for chest pain. Negative for palpitations.  Gastrointestinal: Positive for abdominal pain. Negative for constipation, diarrhea, heartburn, nausea and vomiting.  Genitourinary: Negative.   Musculoskeletal: Positive for back pain.  Skin: Negative.   Neurological: Negative.  Negative for weakness.  Psychiatric/Behavioral: Negative.      PHYSICAL EXAM: BP 129/66   Pulse 73   Temp 98.1 F (36.7 C) (Oral)   Resp 13   Ht 5\' 5"  (1.651 m)   Wt 79.4 kg (175 lb)   LMP  (LMP Unknown)   SpO2 98%   BMI 29.12 kg/m   Physical Exam  Constitutional: She is oriented to person, place, and time. She appears well-developed and well-nourished. No distress.  HENT:  Head: Normocephalic and atraumatic.  Eyes: EOM are normal. Pupils are equal, round, and reactive to light.  Neck: Normal range of motion. Neck supple.  Cardiovascular: Normal rate, regular rhythm and normal heart sounds.  Exam reveals no gallop.   No murmur heard. Pulmonary/Chest: Effort normal and breath sounds normal. She has no wheezes.  Abdominal: Soft. Bowel sounds are normal. She exhibits no distension and no mass. There is tenderness. There is no guarding.  Musculoskeletal: Normal range of motion. She exhibits no edema or deformity.  Neurological: She is oriented to person, place, and time.  Skin: She is not diaphoretic.  Psychiatric: Her behavior is normal. Judgment and thought content  normal.   Her abdomen was soft with good bowel sounds. I cannot palpate any significant guarding or rebound. She has no point tenderness. She has no CVA tenderness.  Impression/Plan: Independently reviewed her CT scan. I would agree with working diagnosis of cholecystitis and cholelithiasis. She does not have  significant elevation her white blood cell count. However, we will start her on antibiotic therapy in anticipation of surgical intervention. She did have a large IV contrast load this evening so I will not repeat her CT scan at the present time. I do not think her splenic masses involved in the current problem. I would recommend that we perform a diagnostic CT scan sometime in the postoperative course to better evaluate her splenic lesion. The surgical intervention with her in detail and we will tentatively plan her for surgery later today. I outlined the fact that one of my surgical partners would be involved in her care at that time. She is in agreement.   Tiney Rougealph Ely III, MD  08/23/2016, 6:06 AM

## 2016-08-23 NOTE — ED Notes (Signed)
Pt states that she ate chili beans tonight then laid down with her son, pt states that she started having severe pain in the center of her chest that is central then moves outward bilaterally and pt also c/o pain in the center of her back, pt denies ever feeling this way in the past, pt reports nausea and watery mouth, no vomiting, pt doesn't want her chest or abd touched due to the pain

## 2016-08-23 NOTE — Anesthesia Preprocedure Evaluation (Signed)
Anesthesia Evaluation  Patient identified by MRN, date of birth, ID band Patient awake    Reviewed: Allergy & Precautions, NPO status , Patient's Chart, lab work & pertinent test results  History of Anesthesia Complications Negative for: history of anesthetic complications  Airway Mallampati: I  TM Distance: >3 FB Neck ROM: Full    Dental no notable dental hx.    Pulmonary neg sleep apnea, neg COPD, Current Smoker,    breath sounds clear to auscultation- rhonchi (-) wheezing      Cardiovascular Exercise Tolerance: Good (-) hypertension(-) CAD and (-) Past MI  Rhythm:Regular Rate:Normal - Systolic murmurs and - Diastolic murmurs    Neuro/Psych negative neurological ROS  negative psych ROS   GI/Hepatic negative GI ROS, Neg liver ROS,   Endo/Other  negative endocrine ROSneg diabetes  Renal/GU negative Renal ROS     Musculoskeletal negative musculoskeletal ROS (+)   Abdominal (+) - obese,   Peds  Hematology negative hematology ROS (+)   Anesthesia Other Findings   Reproductive/Obstetrics                             Anesthesia Physical Anesthesia Plan  ASA: II  Anesthesia Plan: General   Post-op Pain Management:    Induction: Intravenous  Airway Management Planned: Oral ETT  Additional Equipment:   Intra-op Plan:   Post-operative Plan: Extubation in OR  Informed Consent: I have reviewed the patients History and Physical, chart, labs and discussed the procedure including the risks, benefits and alternatives for the proposed anesthesia with the patient or authorized representative who has indicated his/her understanding and acceptance.   Dental advisory given  Plan Discussed with: CRNA and Anesthesiologist  Anesthesia Plan Comments:         Anesthesia Quick Evaluation

## 2016-08-23 NOTE — Anesthesia Procedure Notes (Signed)
Procedure Name: Intubation Performed by: Aline Brochure Pre-anesthesia Checklist: Patient identified, Emergency Drugs available, Suction available and Patient being monitored Patient Re-evaluated:Patient Re-evaluated prior to inductionOxygen Delivery Method: Circle system utilized Preoxygenation: Pre-oxygenation with 100% oxygen Intubation Type: IV induction Ventilation: Mask ventilation without difficulty Laryngoscope Size: Mac and 3 Grade View: Grade I Tube type: Oral Tube size: 7.0 mm Number of attempts: 1 Airway Equipment and Method: Stylet Placement Confirmation: ETT inserted through vocal cords under direct vision,  positive ETCO2 and breath sounds checked- equal and bilateral Secured at: 21 cm Tube secured with: Tape Dental Injury: Teeth and Oropharynx as per pre-operative assessment

## 2016-08-23 NOTE — Progress Notes (Signed)
Fluoroscopy has been seen and examined by me her clinical picture is consistent with acute cholecystitis she is in need for cholecystectomy. I discussed the procedure in detail.  We discussed the risks and benefits of a laparoscopic cholecystectomy and possible cholangiogram including, but not limited to bleeding, infection, injury to surrounding structures such as the intestine or liver, bile leak, retained gallstones, need to convert to an open procedure, prolonged diarrhea, blood clots such as  DVT, common bile duct injury, anesthesia risks, and possible need for additional procedures.  The likelihood of improvement in symptoms and return to the patient's normal status is good. We discussed the typical post-operative recovery course.

## 2016-08-23 NOTE — Progress Notes (Signed)
Pt stated to RN that she does not want her jewelry at her bedside when she was in the OR. RN called security to place pt.'s belongings in locker. Key to the locker is in the chart. Pt.'s necklace with charm and a pair of earrings are in the locker.   Erica Mejia [place]

## 2016-08-23 NOTE — Transfer of Care (Signed)
Immediate Anesthesia Transfer of Care Note  Patient: Erica Mejia  Procedure(s) Performed: Procedure(s): LAPAROSCOPIC CHOLECYSTECTOMY (N/A)  Patient Location: PACU  Anesthesia Type:General  Level of Consciousness: sedated  Airway & Oxygen Therapy: Patient Spontanous Breathing and Patient connected to face mask oxygen  Post-op Assessment: Report given to RN and Post -op Vital signs reviewed and stable  Post vital signs: Reviewed and stable  Last Vitals:  Vitals:   08/23/16 1505 08/23/16 1506  BP: 133/77 133/77  Pulse: 75 70  Resp: (!) 9 14  Temp: 36.4 C     Last Pain:  Vitals:   08/23/16 1245  TempSrc: Tympanic  PainSc:       Patients Stated Pain Goal: 1 (08/23/16 1102)  Complications: No apparent anesthesia complications

## 2016-08-23 NOTE — ED Notes (Signed)
Pt calling out, pt states that she is nauseated, pt given an emesis bag and a wet washcloth for comfort

## 2016-08-23 NOTE — ED Notes (Signed)
Patient transported to Ultrasound 

## 2016-08-24 ENCOUNTER — Encounter: Payer: Self-pay | Admitting: Surgery

## 2016-08-24 MED ORDER — HYDROCODONE-ACETAMINOPHEN 5-325 MG PO TABS: 1.0000 | ORAL_TABLET | ORAL | 0 refills | Status: DC | PRN

## 2016-08-24 NOTE — Anesthesia Postprocedure Evaluation (Signed)
Anesthesia Post Note  Patient: Erica Mejia  Procedure(s) Performed: Procedure(s) (LRB): LAPAROSCOPIC CHOLECYSTECTOMY (N/A)  Patient location during evaluation: PACU Anesthesia Type: General Level of consciousness: awake and alert and oriented Pain management: pain level controlled Vital Signs Assessment: post-procedure vital signs reviewed and stable Respiratory status: spontaneous breathing, nonlabored ventilation and respiratory function stable Cardiovascular status: blood pressure returned to baseline and stable Postop Assessment: no signs of nausea or vomiting Anesthetic complications: no     Last Vitals:  Vitals:   08/24/16 0513 08/24/16 0809  BP: (!) 120/52 112/69  Pulse: 64 69  Resp: 19 16  Temp: 36.9 C 37 C    Last Pain:  Vitals:   08/24/16 0809  TempSrc: Oral  PainSc:                  Geza Beranek

## 2016-08-24 NOTE — Discharge Summary (Signed)
  Patient ID: Erica FearingLaura Kniskern MRN: [id number] DOB/AGE: [birth year] 40 y.o.  Admit date: 08/22/2016 Discharge date: 08/24/2016   Discharge Diagnoses:  Active Problems:   Cholecystitis   Procedures: Laparoscopic cholecystectomy  Hospital Course: 41 year old female me with right upper quadrant pain nausea and vomiting. Workup confirmed acute cholecystitis by ultrasound. She underwent an uneventful laparoscopic cholecystectomy. She was kept overnight. At the time of discharge she was ambulating, tolerating regular diet. She was afebrile and her vital signs were stable. Her physical exam at the time of discharge reveal a female in no acute distress. Awake, alert. Abdomen: Soft, no peritonitis and no evidence of infection. Incisions were healing well. Extremities: No edema and well perfused. Condition of the patient at time of discharge is stable   Disposition: 01-Home or Self Care  Discharge Instructions    Call MD for:  difficulty breathing, headache or visual disturbances    Complete by:  As directed    Call MD for:  extreme fatigue    Complete by:  As directed    Call MD for:  hives    Complete by:  As directed    Call MD for:  persistant dizziness or light-headedness    Complete by:  As directed    Call MD for:  persistant nausea and vomiting    Complete by:  As directed    Call MD for:  redness, tenderness, or signs of infection (pain, swelling, redness, odor or green/yellow discharge around incision site)    Complete by:  As directed    Call MD for:  severe uncontrolled pain    Complete by:  As directed    Call MD for:  temperature >100.4    Complete by:  As directed    Diet - low sodium heart healthy    Complete by:  As directed    Discharge instructions    Complete by:  As directed    May shower tomorrow, please DC home after am dose of A/B is given   Increase activity slowly    Complete by:  As directed    Lifting restrictions    Complete by:  As directed    20 lbs x 6  weeks   No dressing needed    Complete by:  As directed      Allergies as of 08/24/2016   No Known Allergies     Medication List    TAKE these medications   HYDROcodone-acetaminophen 5-325 MG tablet Commonly known as:  NORCO/VICODIN Take 1-2 tablets by mouth every 4 (four) hours as needed for moderate pain.   levonorgestrel 20 MCG/24HR IUD Commonly known as:  MIRENA 1 each by Intrauterine route once.      Follow-up Information    Leafy Roiego F Nevan Creighton, MD. Go on 09/02/2016.   Specialty:  General Surgery Why:  Thursday at 11:00am for hospital follow-up Contact information: [address] [place] [address] [phone]            Sterling Bigiego Liliauna Santoni, MD FACS

## 2016-08-24 NOTE — Progress Notes (Signed)
08/24/2016 9:53 AM  Erica FearingLaura Mejia to be D/C'd Home per MD order.  Discussed prescriptions and follow up appointments with the patient. Prescriptions given to patient, medication list explained in detail. Pt verbalized understanding.  Allergies as of 08/24/2016   No Known Allergies     Medication List    TAKE these medications   HYDROcodone-acetaminophen 5-325 MG tablet Commonly known as:  NORCO/VICODIN Take 1-2 tablets by mouth every 4 (four) hours as needed for moderate pain.   levonorgestrel 20 MCG/24HR IUD Commonly known as:  MIRENA 1 each by Intrauterine route once.       Vitals:   08/24/16 0513 08/24/16 0809  BP: (!) 120/52 112/69  Pulse: 64 69  Resp: 19 16  Temp: 98.4 F (36.9 C) 98.6 F (37 C)    Skin clean, dry and intact without evidence of skin break down, no evidence of skin tears noted. IV catheter discontinued intact. Site without signs and symptoms of complications. Dressing and pressure applied. Pt denies pain at this time. No complaints noted.  An After Visit Summary was printed and given to the patient. Patient escorted via WC, and D/C home via private auto.  Bradly Chrisougherty, Leone Mobley E

## 2016-08-25 LAB — SURGICAL PATHOLOGY

## 2016-09-02 ENCOUNTER — Encounter: Payer: Managed Care, Other (non HMO) | Admitting: Surgery

## 2016-09-03 ENCOUNTER — Telehealth: Payer: Self-pay
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
## 2016-09-07 ENCOUNTER — Encounter: Payer: Self-pay | Admitting: Surgery

## 2016-09-07 ENCOUNTER — Ambulatory Visit (INDEPENDENT_AMBULATORY_CARE_PROVIDER_SITE_OTHER): Payer: Managed Care, Other (non HMO) | Admitting: Surgery

## 2016-09-07 VITALS — BP 114/74 | HR 76 | Temp 98.4°F | Ht 64.0 in | Wt 161.0 lb

## 2016-09-07 DIAGNOSIS — Z8719 Personal history of other diseases of the digestive system: Secondary | ICD-10-CM | POA: Insufficient documentation

## 2016-09-07 DIAGNOSIS — Z9049 Acquired absence of other specified parts of digestive tract: Secondary | ICD-10-CM | POA: Insufficient documentation
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
## 2016-09-07 NOTE — Patient Instructions (Addendum)
Please call our office with any questions or concerns.   Use sun block to incision area over the next year if this area will be exposed to sun. This helps decrease scarring.  You may resume your normal activities on 09/20/2016. At that time- Listen to your body when lifting, if you have pain when lifting, stop and then try again in a few days. Pain after doing exercises or activities of daily living is normal as you get back in to your normal routine.  If you develop redness, drainage, or pain at incision sites- call our office immediately and speak with a nurse.

## 2017-02-10 ENCOUNTER — Other Ambulatory Visit: Payer: Self-pay | Admitting: Gastroenterology

## 2017-02-10 DIAGNOSIS — D734 Cyst of spleen: Secondary | ICD-10-CM

## 2017-02-21 ENCOUNTER — Ambulatory Visit
Admission: RE | Admit: 2017-02-21 | Discharge: 2017-02-21 | Disposition: A | Discharge status: Home / Self Care | Payer: Managed Care, Other (non HMO) | Source: Ambulatory Visit | Attending: Gastroenterology | Admitting: Gastroenterology

## 2017-02-21 DIAGNOSIS — D734 Cyst of spleen: Secondary | ICD-10-CM | POA: Diagnosis present

## 2017-02-21 MED ORDER — IOPAMIDOL (ISOVUE-300) INJECTION 61%
100.0000 mL | Freq: Once | INTRAVENOUS | Status: AC | PRN
  Administered 2017-02-21: 100 mL via INTRAVENOUS

## 2017-03-02 ENCOUNTER — Ambulatory Visit (INDEPENDENT_AMBULATORY_CARE_PROVIDER_SITE_OTHER): Payer: BC Managed Care – PPO | Admitting: Certified Nurse Midwife

## 2017-03-02 ENCOUNTER — Encounter: Payer: Self-pay | Admitting: Certified Nurse Midwife

## 2017-03-02 ENCOUNTER — Telehealth: Payer: Self-pay | Admitting: Certified Nurse Midwife

## 2017-03-02 VITALS — BP 92/55 | HR 70 | Ht 65.0 in | Wt 173.4 lb

## 2017-03-02 DIAGNOSIS — N898 Other specified noninflammatory disorders of vagina: Secondary | ICD-10-CM

## 2017-03-02 DIAGNOSIS — Z0000 Encounter for general adult medical examination without abnormal findings: Secondary | ICD-10-CM | POA: Diagnosis not present

## 2017-03-02 MED ORDER — CLINDAMYCIN PHOSPHATE 100 MG VA SUPP: 100.0000 mg | Freq: Every day | VAGINAL | 0 refills | Status: DC

## 2017-03-02 MED ORDER — FLUCONAZOLE 150 MG PO TABS: 150.0000 mg | ORAL_TABLET | Freq: Once | ORAL | 0 refills | Status: AC

## 2017-03-02 MED ORDER — MEDROXYPROGESTERONE ACETATE 150 MG/ML IM SUSP: 150.0000 mg | Freq: Once | INTRAMUSCULAR | 3 refills | Status: DC

## 2017-03-02 NOTE — Telephone Encounter (Signed)
Please send in the diflucan to [facility]

## 2017-03-02 NOTE — Patient Instructions (Signed)

## 2017-03-02 NOTE — Progress Notes (Addendum)
GYN ENCOUNTER NOTE  Subjective:       Erica Mejia is a 41 y.o. No obstetric history on file. female is here for gynecologic evaluation of the following issues:  1. Re occuring BV. She state that she has had this since she has had her IUD in place. She has tried multiple regimens  The clindamycin intravaginally worked the best for her but the BV returns. She would like to have the IUD removed.   IUD Removal  Patient identified, verbal  informed consent performed.  Patient was in the dorsal lithotomy position, normal external genitalia was noted.  A speculum was placed in the patient's vagina, normal discharge was noted, no lesions. The cervix was visualized, no lesions, no abnormal discharge.  The strings of the IUD were grasped and pulled using ring forceps. The IUD was removed in its entirety.  Patient tolerated the procedure well.    Patient will use depo for contraception.    Gynecologic History No LMP recorded. Patient is not currently having periods (Reason: IUD). Contraception: IUD- removed today  Last Pap: 2016. Results were: normal Last mammogram: never had one, ordered today   Obstetric History OB History  No data available    Past Medical History:  Diagnosis Date  . Anxiety disorder, unspecified   . Cholelithiasis   . Nicotine use disorder   . Recurrent UTI   . Splenic cyst   . Thoracic spondylosis   . Type 2 diabetes mellitus (HCC)     Past Surgical History:  Procedure Laterality Date  . APPENDECTOMY    . CHOLECYSTECTOMY N/A 08/23/2016   Procedure: LAPAROSCOPIC CHOLECYSTECTOMY;  Surgeon: Leafy Roiego F Pabon, MD;  Location: [facility] ORS;  Service: General;  Laterality: N/A;    Current Outpatient Prescriptions on File Prior to Visit  Medication Sig Dispense Refill  . HYDROcodone-acetaminophen (NORCO/VICODIN) 5-325 MG tablet Take 1-2 tablets by mouth every 4 (four) hours as needed for moderate pain. 30 tablet 0  . levonorgestrel (MIRENA) 20 MCG/24HR IUD 1 each by  Intrauterine route once.     No current facility-administered medications on file prior to visit.     No Known Allergies  Social History   Social History  . Marital status: Divorced    Spouse name: N/A  . Number of children: N/A  . Years of education: N/A   Occupational History  . Not on file.   Social History Main Topics  . Smoking status: Current Every Day Smoker  . Smokeless tobacco: Never Used  . Alcohol use No  . Drug use: Yes    Types: Marijuana     Comment: last smoked today  . Sexual activity: Not on file   Other Topics Concern  . Not on file   Social History Narrative  . No narrative on file    Family History  Problem Relation Age of Onset  . Healthy Mother   . Healthy Father   . Hypertension Father   . Cancer Father        skin cancer  . Hypertension Maternal Grandmother   . Cancer Maternal Grandmother        colon cancer  . Hypertension Maternal Grandfather   . Diabetes Maternal Grandfather   . Stroke Maternal Grandfather   . Diabetes Paternal Grandfather   . Hypertension Paternal Grandfather     The following portions of the patient's history were reviewed and updated as appropriate: allergies, current medications, past family history, past medical history, past social history, past surgical history and  problem list.  Review of Systems Review of Systems - Negative except as mentioned in HPI Review of Systems - General ROS: negative for - chills, fatigue, fever, hot flashes, malaise or night sweats Hematological and Lymphatic ROS: negative for - bleeding problems or swollen lymph nodes Gastrointestinal ROS: negative for - abdominal pain, blood in stools, change in bowel habits and nausea/vomiting Musculoskeletal ROS: negative for - joint pain, muscle pain or muscular weakness Genito-Urinary ROS: negative for - change in menstrual cycle, dysmenorrhea, dyspareunia, dysuria, genital ulcers, hematuria, incontinence, irregular/heavy menses, nocturia or  pelvic pain. Positive for genital discharge  with odor.   Objective:   There were no vitals taken for this visit. CONSTITUTIONAL: Well-developed, well-nourished female in no acute distress.  HENT:  Normocephalic, atraumatic.  NECK: Normal range of motion, SKIN: Skin is warm and dry. No rash noted. Not diaphoretic. No erythema. No pallor. NEUROLGIC: Alert and oriented to person, place, and time.  PSYCHIATRIC: Normal mood and affect. Normal behavior. Normal judgment and thought content. CARDIOVASCULAR:Not Examined RESPIRATORY: Not Examined BREASTS: Not Examined ABDOMEN: Soft, non distended;  External Genitalia: Normal  BUS: Normal  Vagina: Normal, discharge white with odor   Cervix: Normal MUSCULOSKELETAL: Normal range of motion. No tenderness.  No cyanosis, clubbing, or edema.  Assessment:   Vaginitis   Plan:   Depo provera for BC. Risks and benefits of use discussed. Pt to have prescription filled then return for injection. Clindamycin vaginal suppository's and diflucan ordered. If BV reoccurs will treat with metronidazole gel twice weekly for 4 months.  Mammogram ordered for preventative maintenance. Discussed safe sex and use of condoms until depo provera active. She agrees to plan of care. Follow up as needed if symptoms do not resolve.  Doreene Burke, CNM

## 2017-03-11 LAB — NUSWAB VAGINITIS PLUS (VG+)
ATOPOBIUM VAGINAE: HIGH {score} — AB
BVAB 2: HIGH Score — AB
CANDIDA ALBICANS, NAA: POSITIVE — AB
CANDIDA GLABRATA, NAA: POSITIVE — AB
Chlamydia trachomatis, NAA: NEGATIVE
MEGASPHAERA 1: HIGH {score} — AB
Neisseria gonorrhoeae, NAA: NEGATIVE
Trich vag by NAA: NEGATIVE

## 2017-05-03 ENCOUNTER — Telehealth: Payer: Self-pay | Admitting: Certified Nurse Midwife

## 2017-05-03 DIAGNOSIS — B9689 Other specified bacterial agents as the cause of diseases classified elsewhere: Secondary | ICD-10-CM

## 2017-05-03 DIAGNOSIS — N760 Acute vaginitis: Principal | ICD-10-CM

## 2017-05-03 MED ORDER — METRONIDAZOLE 500 MG PO TABS: 500.0000 mg | ORAL_TABLET | Freq: Two times a day (BID) | ORAL | 0 refills | Status: DC

## 2017-05-03 NOTE — Telephone Encounter (Signed)
Pt stated that she was seen for BV Infection July 2018 and she thinks she has another one. Pt stated it would be difficult for her to come in this week b/c she is working 12 hour days. Pt is requesting a Rx be sent to [facility]. Pt stated that metro gel works best. Please advise. Thanks TNP

## 2017-05-03 NOTE — Telephone Encounter (Signed)
RX sent. Pt to be seen if sx do not improve.

## 2017-05-12 ENCOUNTER — Telehealth: Payer: Self-pay | Admitting: Certified Nurse Midwife

## 2017-05-12 NOTE — Telephone Encounter (Signed)
The flagyl is not working - could she get the Metrogel sent in to the pharmacy??  Please call   [facility]

## 2017-05-13 MED ORDER — METRONIDAZOLE 0.75 % VA GEL: 1.0000 | Freq: Every day | VAGINAL | 1 refills | Status: DC

## 2017-05-13 NOTE — Telephone Encounter (Signed)
Spoke with pt and gave providers instructions. She made appointment for Monday with Pattricia Boss. Script sent to [facility] per pt.

## 2017-05-13 NOTE — Telephone Encounter (Signed)
Pt called back b/c she hadn't heard back about changing the Rx. Pt is requesting that someone send in Metrogel for her because metroNIDAZOLE (FLAGYL) 500 MG tablet isn't working. Please advise. Thanks TNP

## 2017-05-13 NOTE — Telephone Encounter (Signed)
Erica Mejia, please let patient know that Pattricia Boss is out of the office, But per me, she may have Metrogel 0.75%, one full applicator intravaginally, once a day for the next five (5) days. If symptoms do not resolve, then she should probably come in. Also, encourage her to activate her MyChart. Thanks, JML

## 2017-05-16 ENCOUNTER — Encounter: Payer: Self-pay | Admitting: Certified Nurse Midwife

## 2017-05-16 ENCOUNTER — Ambulatory Visit (INDEPENDENT_AMBULATORY_CARE_PROVIDER_SITE_OTHER): Payer: BC Managed Care – PPO | Admitting: Certified Nurse Midwife

## 2017-05-16 VITALS — BP 133/84 | HR 83 | Ht 65.0 in | Wt 178.8 lb

## 2017-05-16 DIAGNOSIS — N898 Other specified noninflammatory disorders of vagina: Secondary | ICD-10-CM

## 2017-05-16 NOTE — Progress Notes (Signed)
GYN ENCOUNTER NOTE  Subjective:       Erica Mejia is a 41 y.o. G28P2000 female is here for gynecologic evaluation of the following issues:  1. Increased Vaginal discharge with odor . She started having symptoms in [place] and called for prescription. She was ordered medication but started having her period which she believed caused the medication to not work appropriately.    Gynecologic History No LMP recorded. Patient is not currently having periods (Reason: IUD). Contraception: Depo-Provera injections   Obstetric History OB History  Gravida Para Term Preterm AB Living  SAB TAB Ectopic Multiple Live Births               # Outcome Date GA Lbr Len/2nd Weight Sex Delivery Anes PTL Lv  2 Term 05/23/15 [redacted]w[redacted]d   M Vag-Spont     1 Term 12/10/97 [redacted]w[redacted]d   M Vag-Spont         Past Medical History:  Diagnosis Date  . Anxiety disorder, unspecified   . Cholelithiasis   . Nicotine use disorder   . Recurrent UTI   . Splenic cyst   . Thoracic spondylosis   . Type 2 diabetes mellitus (HCC)     Past Surgical History:  Procedure Laterality Date  . APPENDECTOMY    . CHOLECYSTECTOMY N/A 08/23/2016   Procedure: LAPAROSCOPIC CHOLECYSTECTOMY;  Surgeon: Leafy Ro, MD;  Location: [facility] ORS;  Service: General;  Laterality: N/A;    Current Outpatient Prescriptions on File Prior to Visit  Medication Sig Dispense Refill  . clindamycin (CLEOCIN) 100 MG vaginal suppository Place 1 suppository (100 mg total) vaginally at bedtime. 3 suppository 0  . levonorgestrel (MIRENA) 20 MCG/24HR IUD 1 each by Intrauterine route once.    . medroxyPROGESTERone (DEPO-PROVERA) 150 MG/ML injection Inject 1 mL (150 mg total) into the muscle once. 1 mL 3  . metroNIDAZOLE (FLAGYL) 500 MG tablet Take 1 tablet (500 mg total) by mouth 2 (two) times daily. 14 tablet 0  . metroNIDAZOLE (METROGEL) 0.75 % vaginal gel Place 1 Applicatorful vaginally at bedtime. Apply one applicatorful to vagina at bedtime for  5 days 70 g 1  . sucralfate (CARAFATE) 1 g tablet Take by mouth.     No current facility-administered medications on file prior to visit.     No Known Allergies  Social History   Social History  . Marital status: Divorced    Spouse name: N/A  . Number of children: N/A  . Years of education: N/A   Occupational History  . Not on file.   Social History Main Topics  . Smoking status: Current Every Day Smoker  . Smokeless tobacco: Never Used  . Alcohol use No  . Drug use: No     Comment: , no longer smokes MARIJUANA  . Sexual activity: Not Currently   Other Topics Concern  . Not on file   Social History Narrative  . No narrative on file    Family History  Problem Relation Age of Onset  . Healthy Mother   . Healthy Father   . Hypertension Father   . Cancer Father        skin cancer  . Hypertension Maternal Grandmother   . Cancer Maternal Grandmother        colon cancer  . Hypertension Maternal Grandfather   . Diabetes Maternal Grandfather   . Stroke Maternal Grandfather   . Diabetes Paternal Grandfather   . Hypertension Paternal Grandfather  The following portions of the patient's history were reviewed and updated as appropriate: allergies, current medications, past family history, past medical history, past social history, past surgical history and problem list.  Review of Systems Review of Systems - Negative except as mentioned in HPI Review of Systems - General ROS: negative for - chills, fatigue, fever, hot flashes, malaise or night sweats Hematological and Lymphatic ROS: negative for - bleeding problems or swollen lymph nodes Gastrointestinal ROS: negative for - abdominal pain, blood in stools, change in bowel habits and nausea/vomiting Musculoskeletal ROS: negative for - joint pain, muscle pain or muscular weakness Genito-Urinary ROS: negative for - change in menstrual cycle, dysmenorrhea, dyspareunia, dysuria, genital ulcers, hematuria, incontinence,  irregular/heavy menses, nocturia or pelvic pain. Positive for increased vaginal discharge with odor  Objective:   There were no vitals taken for this visit. CONSTITUTIONAL: Well-developed, well-nourished female in no acute distress.  HENT:  Normocephalic, atraumatic.  NECK: Normal range of motion, supple, no masses.  Normal thyroid.  SKIN: Skin is warm and dry. No rash noted. Not diaphoretic. No erythema. No pallor. NEUROLGIC: Alert and oriented to person, place, and time. PSYCHIATRIC: Normal mood and affect. Normal behavior. Normal judgment and thought content. CARDIOVASCULAR:Not Examined RESPIRATORY: Not Examined BREASTS: Not Examined ABDOMEN: Soft, non distended; Non tender.  No Organomegaly. PELVIC:  External Genitalia: Normal  BUS: Normal  Vagina: Normal, white particulate discharge  Cervix: Normal MUSCULOSKELETAL: Normal range of motion. No tenderness.  No cyanosis, clubbing, or edema.   Assessment:   There are no diagnoses linked to this encounter.    Plan:  Nuswab, will call with results. Encouraged pt to use boric acid as preventative method during her cycle, after sex, or when on antibiotic. She agrees to plan. Follow up as needed.   Doreene Burke, CNM

## 2017-05-16 NOTE — Patient Instructions (Signed)

## 2017-05-20 ENCOUNTER — Encounter: Payer: Self-pay | Admitting: Certified Nurse Midwife

## 2017-05-20 ENCOUNTER — Other Ambulatory Visit: Payer: Self-pay | Admitting: Certified Nurse Midwife

## 2017-05-20 LAB — NUSWAB BV AND CANDIDA, NAA
Candida albicans, NAA: NEGATIVE
Candida glabrata, NAA: NEGATIVE

## 2017-05-20 MED ORDER — METRONIDAZOLE 0.75 % VA GEL: 1.0000 | Freq: Every day | VAGINAL | 1 refills | Status: DC

## 2017-05-20 MED ORDER — FLUCONAZOLE 150 MG PO TABS: 150.0000 mg | ORAL_TABLET | Freq: Once | ORAL | 1 refills | Status: AC

## 2017-05-20 NOTE — Progress Notes (Signed)
Pt has BV & yeast infection per nusWab results. Order placed. PT notified via my chart.   Doreene Burke, CNM

## 2017-05-23 ENCOUNTER — Other Ambulatory Visit: Payer: Self-pay | Admitting: Certified Nurse Midwife

## 2017-05-23 ENCOUNTER — Other Ambulatory Visit
Admission: RE | Admit: 2017-05-23 | Discharge: 2017-05-23 | Disposition: A | Discharge status: Home / Self Care | Payer: BC Managed Care – PPO | Source: Ambulatory Visit | Attending: Gastroenterology | Admitting: Gastroenterology

## 2017-05-23 DIAGNOSIS — A048 Other specified bacterial intestinal infections: Secondary | ICD-10-CM | POA: Insufficient documentation

## 2017-05-23 DIAGNOSIS — T3695XA Adverse effect of unspecified systemic antibiotic, initial encounter: Secondary | ICD-10-CM | POA: Insufficient documentation

## 2017-05-23 DIAGNOSIS — K521 Toxic gastroenteritis and colitis: Secondary | ICD-10-CM | POA: Diagnosis present

## 2017-05-23 DIAGNOSIS — R110 Nausea: Secondary | ICD-10-CM | POA: Diagnosis present

## 2017-05-23 MED ORDER — FLUCONAZOLE 150 MG PO TABS: 150.0000 mg | ORAL_TABLET | Freq: Once | ORAL | 1 refills | Status: AC

## 2017-05-23 NOTE — Progress Notes (Signed)
Pt has recurrent yeast and BV, she is now having increased white particulate discharge. Prescription for diflucan sent to pharmacy. Recommend use of vaginal suppository of boric acid to maintain vaginal health.   Doreene Burke, CNM

## 2017-05-25 LAB — H. PYLORI ANTIGEN, STOOL: H. Pylori Stool Ag, Eia: NEGATIVE

## 2017-08-22 ENCOUNTER — Encounter: Payer: Self-pay | Admitting: Certified Nurse Midwife

## 2017-08-22 ENCOUNTER — Ambulatory Visit (INDEPENDENT_AMBULATORY_CARE_PROVIDER_SITE_OTHER): Payer: BC Managed Care – PPO | Admitting: Certified Nurse Midwife

## 2017-08-22 ENCOUNTER — Other Ambulatory Visit: Payer: Self-pay

## 2017-08-22 VITALS — BP 120/77 | HR 106 | Wt 175.0 lb

## 2017-08-22 DIAGNOSIS — N898 Other specified noninflammatory disorders of vagina: Secondary | ICD-10-CM | POA: Diagnosis not present

## 2017-08-22 MED ORDER — METRONIDAZOLE 500 MG PO TABS: 500.0000 mg | ORAL_TABLET | Freq: Two times a day (BID) | ORAL | 0 refills | Status: AC

## 2017-08-22 MED ORDER — METRONIDAZOLE 0.75 % EX GEL: 1.0000 "application " | CUTANEOUS | 6 refills | Status: DC

## 2017-08-22 MED ORDER — MEDROXYPROGESTERONE ACETATE 150 MG/ML IM SUSP: 150.0000 mg | Freq: Once | INTRAMUSCULAR | 3 refills | Status: DC

## 2017-08-22 MED ORDER — FLUCONAZOLE 150 MG PO TABS: 150.0000 mg | ORAL_TABLET | ORAL | 0 refills | Status: AC

## 2017-08-22 MED ORDER — METRONIDAZOLE 0.75 % VA GEL: 1.0000 | VAGINAL | 5 refills | Status: DC

## 2017-08-22 NOTE — Patient Instructions (Signed)

## 2017-08-22 NOTE — Progress Notes (Signed)
GYN ENCOUNTER NOTE  Subjective:       Erica Mejia is a 42 y.o. 202P2000 female is here for gynecologic evaluation of the following issues:  1. BV she has had increased odor. She states it started before christmas . She used metro gel x 5 days it got better x 3 days then returned. She used the metro gel again for 5 days and it has improved but continues to be an issue. She  Has BV or yeast monthly.    Gynecologic History No LMP recorded. Patient has had an injection. Contraception: Depo-Provera injections Last Pap: 08/17/2014. Results were: normal Last mammogram: needs to be done, ordered 03/02/17 Results were:not completed   Obstetric History OB History  Gravida Para Term Preterm AB Living  2 2 2         SAB TAB Ectopic Multiple Live Births               # Outcome Date GA Lbr Len/2nd Weight Sex Delivery Anes PTL Lv  2 Term 05/23/15 755w0d   M Vag-Spont     1 Term 12/10/97 465w0d   M Vag-Spont         Past Medical History:  Diagnosis Date  . Anxiety disorder, unspecified   . Cholelithiasis   . Nicotine use disorder   . Recurrent UTI   . Splenic cyst   . Thoracic spondylosis   . Type 2 diabetes mellitus (HCC)     Past Surgical History:  Procedure Laterality Date  . APPENDECTOMY    . CHOLECYSTECTOMY N/A 08/23/2016   Procedure: LAPAROSCOPIC CHOLECYSTECTOMY;  Surgeon: Leafy Roiego F Pabon, MD;  Location: [facility] ORS;  Service: General;  Laterality: N/A;    Current Outpatient Medications on File Prior to Visit  Medication Sig Dispense Refill  . medroxyPROGESTERone (DEPO-PROVERA) 150 MG/ML injection Inject 150 mg into the muscle every 3 (three) months.    . medroxyPROGESTERone (DEPO-PROVERA) 150 MG/ML injection Inject 1 mL (150 mg total) into the muscle once. 1 mL 3  . metroNIDAZOLE (FLAGYL) 500 MG tablet Take 1 tablet (500 mg total) by mouth 2 (two) times daily. (Patient not taking: Reported on 05/16/2017) 14 tablet 0  . metroNIDAZOLE (METROGEL) 0.75 % vaginal gel Place 1 Applicatorful  vaginally at bedtime. Apply one applicatorful to vagina at bedtime for 5 days (Patient not taking: Reported on 08/22/2017) 70 g 1  . sucralfate (CARAFATE) 1 g tablet Take by mouth.     No current facility-administered medications on file prior to visit.     No Known Allergies  Social History   Socioeconomic History  . Marital status: Divorced    Spouse name: Not on file  . Number of children: Not on file  . Years of education: Not on file  . Highest education level: Not on file  Social Needs  . Financial resource strain: Not on file  . Food insecurity - worry: Not on file  . Food insecurity - inability: Not on file  . Transportation needs - medical: Not on file  . Transportation needs - non-medical: Not on file  Occupational History  . Not on file  Tobacco Use  . Smoking status: Current Every Day Smoker  . Smokeless tobacco: Never Used  Substance and Sexual Activity  . Alcohol use: No  . Drug use: No    Comment: , no longer smokes MARIJUANA  . Sexual activity: Not Currently    Birth control/protection: Injection  Other Topics Concern  . Not on file  Social History Narrative  .  Not on file    Family History  Problem Relation Age of Onset  . Healthy Mother   . Healthy Father   . Hypertension Father   . Cancer Father        skin cancer  . Hypertension Maternal Grandmother   . Cancer Maternal Grandmother        colon cancer  . Hypertension Maternal Grandfather   . Diabetes Maternal Grandfather   . Stroke Maternal Grandfather   . Diabetes Paternal Grandfather   . Hypertension Paternal Grandfather     The following portions of the patient's history were reviewed and updated as appropriate: allergies, current medications, past family history, past medical history, past social history, past surgical history and problem list.  Review of Systems Review of Systems - Negative except as mentioned in HPI Review of Systems - General ROS: negative for - chills, fatigue,  fever, hot flashes, malaise or night sweats Hematological and Lymphatic ROS: negative for - bleeding problems or swollen lymph nodes Gastrointestinal ROS: negative for - abdominal pain, blood in stools, change in bowel habits and nausea/vomiting Musculoskeletal ROS: negative for - joint pain, muscle pain or muscular weakness Genito-Urinary ROS: negative for - change in menstrual cycle, dysmenorrhea, dyspareunia, dysuria, genital discharge, genital ulcers, hematuria, incontinence, irregular/heavy menses, nocturia or pelvic pain. Positive for increased vaginal discharge with odor.  Objective:   BP 120/77   Pulse (!) 106   Wt 175 lb (79.4 kg)   BMI 29.12 kg/m  CONSTITUTIONAL: Well-developed, well-nourished female in no acute distress.  HENT:  Normocephalic, atraumatic.  NECK: Normal range of motion, SKIN: Skin is warm and dry. No rash noted. Not diaphoretic. No erythema. No pallor. NEUROLGIC: Alert and oriented to person, place, and time.  PSYCHIATRIC: Normal mood and affect. Normal behavior. Normal judgment and thought content. CARDIOVASCULAR:Not Examined RESPIRATORY: Not Examined BREASTS: Not Examined ABDOMEN: Soft, non distended; Non tender.  No Organomegaly. PELVIC:  External Genitalia: Normal  BUS: Normal  Vagina: Normal, white thick discharge.   Cervix: Normal  Uterus: Normal size, shape,consistency, mobile  Adnexa: Normal  RV: Normal   Bladder: Nontender MUSCULOSKELETAL: Normal range of motion. No tenderness.  No cyanosis, clubbing, or edema.  Assessment:   Recurrent Bacterial Vaginosis    Plan:   Plan for treatment recurrent BV infection. Metronidazole orally then metrogel twice weekly for 6 months. She also has history of recurrent yeast due to the BV. Plan for once a week diflucan x 6 months.  Pt verbalizes understanding and agrees to plan of care. Follow up for annual exam /pap smear at earliest convince.   Doreene Burke, CNM

## 2017-08-24 ENCOUNTER — Encounter: Payer: Self-pay | Admitting: Certified Nurse Midwife

## 2017-08-24 LAB — NUSWAB BV AND CANDIDA, NAA
Atopobium vaginae: HIGH Score — AB
BVAB 2: HIGH Score — AB
CANDIDA GLABRATA, NAA: NEGATIVE
Candida albicans, NAA: NEGATIVE
Megasphaera 1: HIGH Score — AB

## 2018-07-16 ENCOUNTER — Other Ambulatory Visit: Payer: Self-pay | Admitting: Certified Nurse Midwife

## 2019-01-02 IMAGING — CT CT ANGIO CHEST
2 of 6 series · 18 of 46 positions shown · IV contrast (APPLIED)
Comparison: None.

CLINICAL DATA: 40 y/o  F; lower chest pain radiating to the back.

EXAM:
CT ANGIOGRAPHY CHEST WITH CONTRAST
TECHNIQUE: Multidetector CT imaging of the chest was performed using the
standard protocol during bolus administration of intravenous
contrast. Multiplanar CT image reconstructions and MIPs were
obtained to evaluate the vascular anatomy.
CONTRAST:  75 cc Omnipaque 370.

[Series 6: thins · axial · 0.67mm/px · z∈[-310,-80]mm · 16 of 252 slices shown]
[im 11/252  lung]
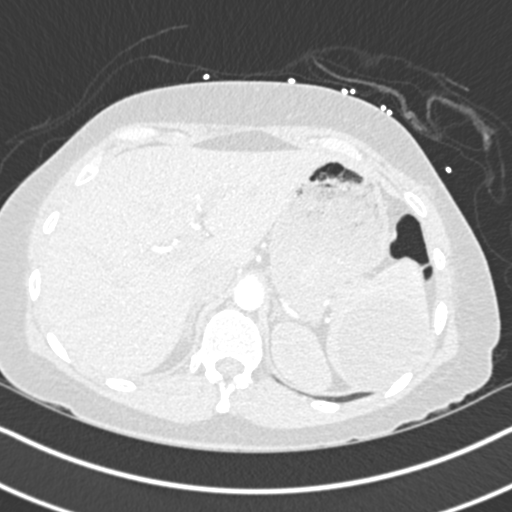
[im 33/252  soft-tissue]
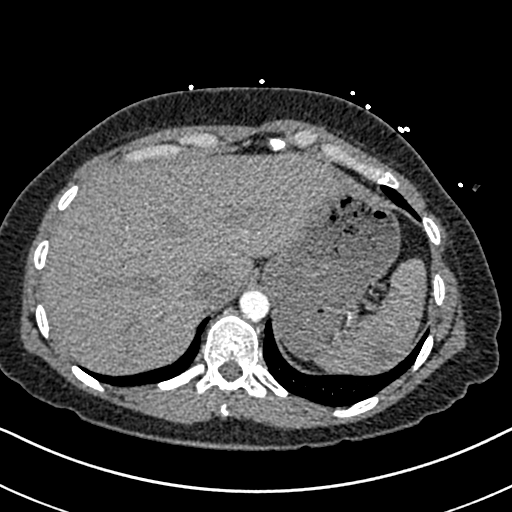
[im 44/252  lung]
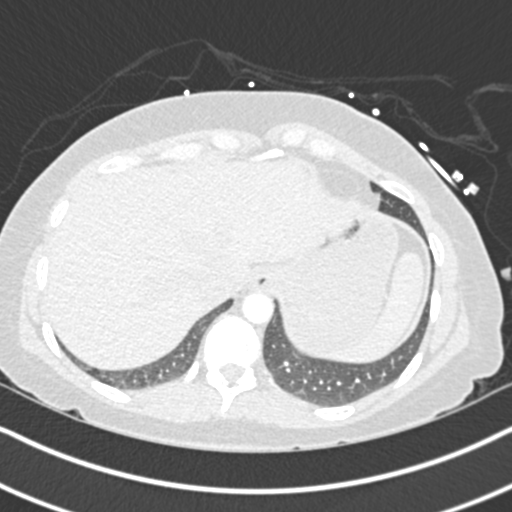
[im 55/252  soft-tissue]
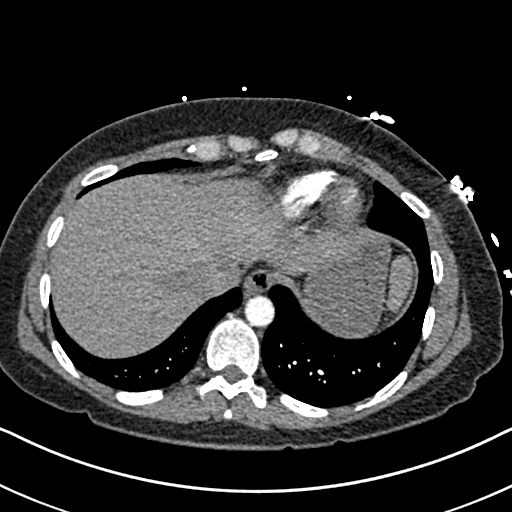
[im 77/252  lung]
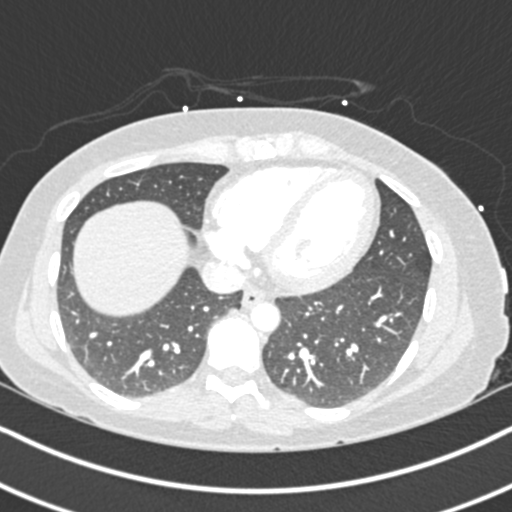
[im 88/252  soft-tissue]
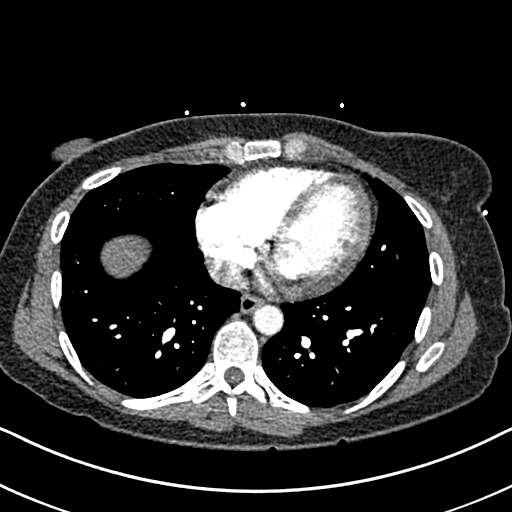
[im 99/252  lung]
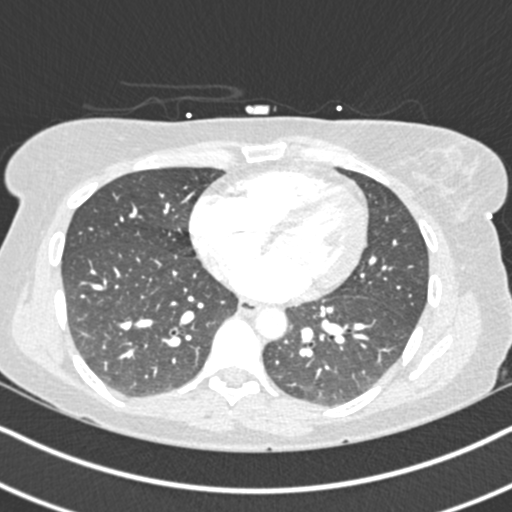
[im 121/252  soft-tissue]
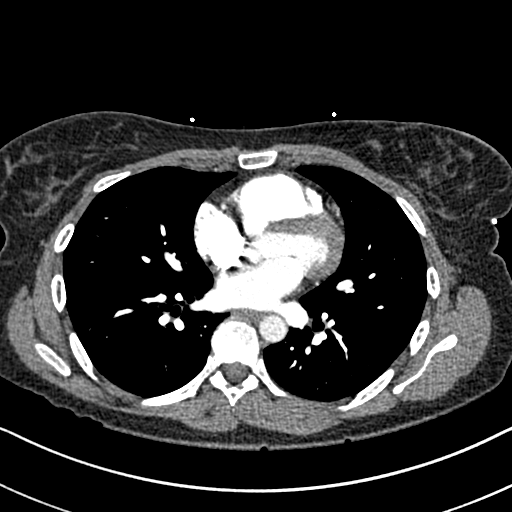
[im 131/252  lung]
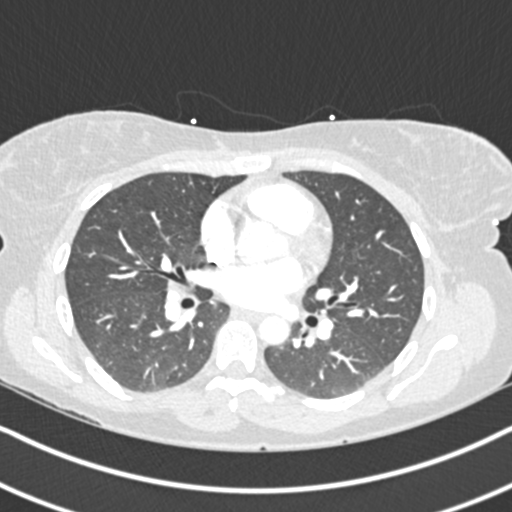
[im 153/252  soft-tissue]
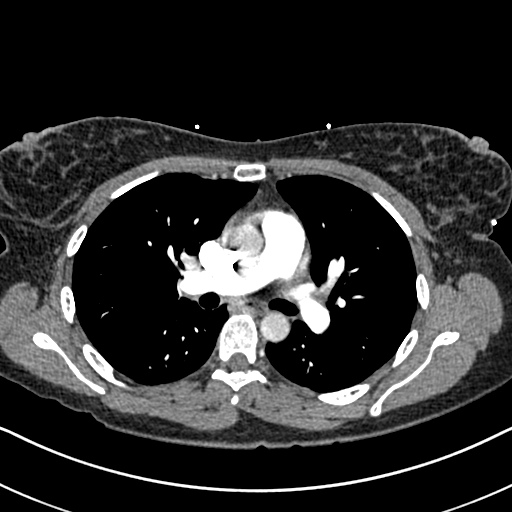
[im 164/252  lung]
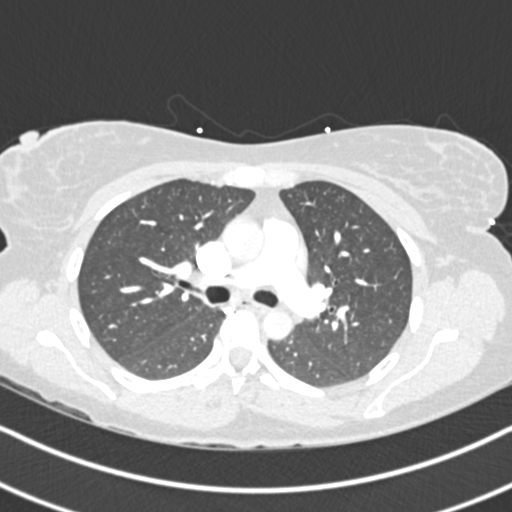
[im 175/252  soft-tissue]
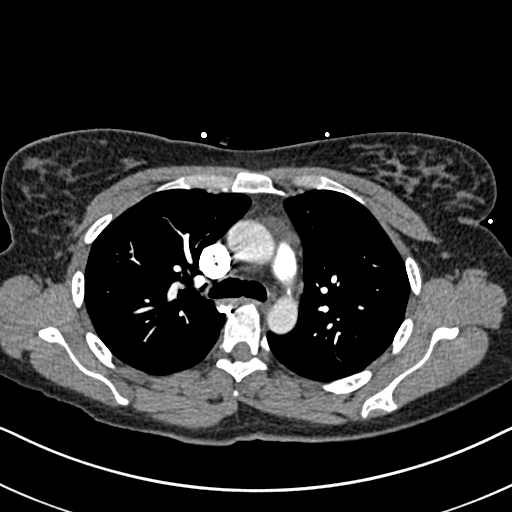
[im 197/252  lung]
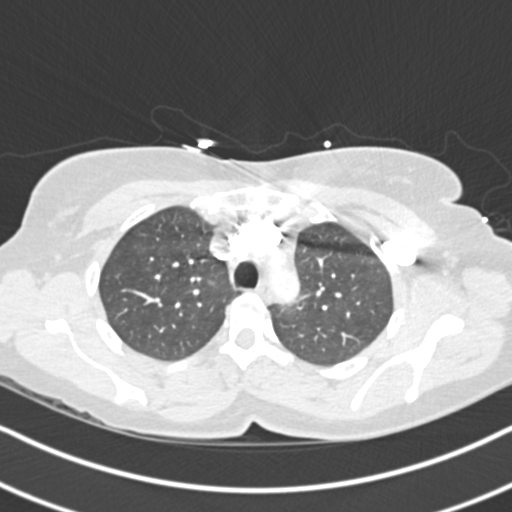
[im 208/252  soft-tissue]
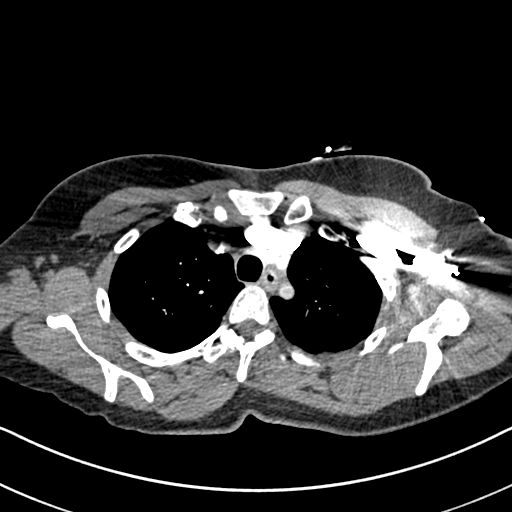
[im 219/252  lung]
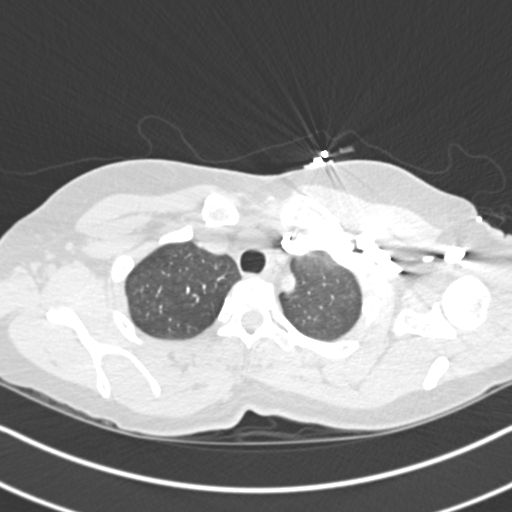
[im 241/252  soft-tissue]
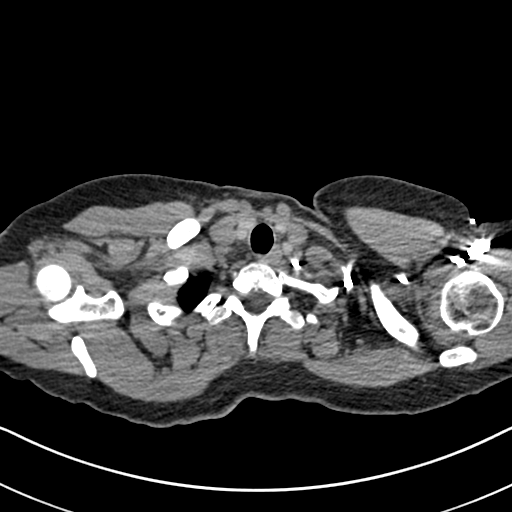

[Series 8: coronal mpr · coronal · 0.49mm/px · 2 of 79 slices shown]
[im 27/79  soft-tissue]
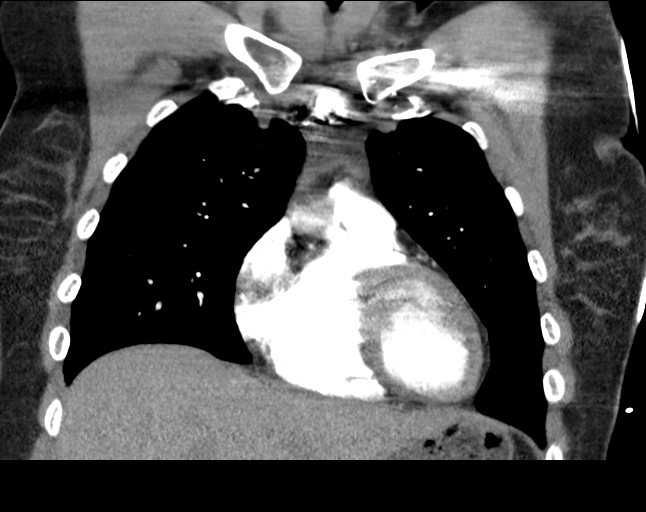
[im 53/79  soft-tissue]
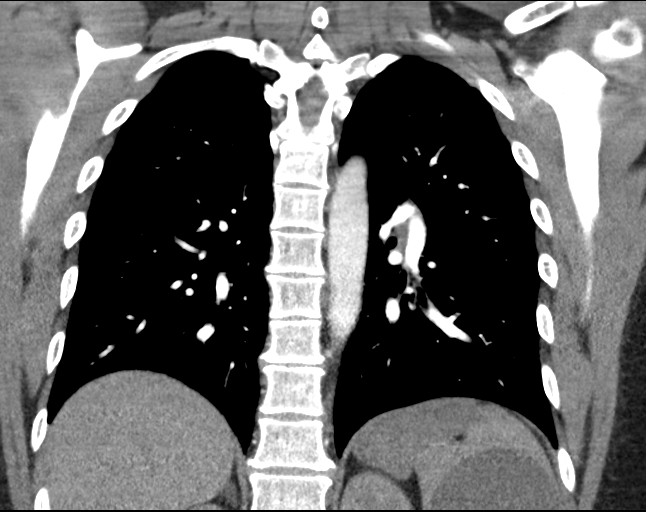

[18 of 46 positions shown; findings below may reference images not displayed]

FINDINGS: Cardiovascular: Satisfactory opacification of the pulmonary arteries
to the segmental level. No evidence of pulmonary embolism. Normal
heart size. No pericardial effusion.

Mediastinum/Nodes: No enlarged mediastinal, hilar, or axillary lymph
nodes. Thyroid gland, trachea, and esophagus demonstrate no
significant findings.

Lungs/Pleura: Lungs are clear. No pleural effusion or pneumothorax.

Upper Abdomen: Partially visualize well-circumscribed fluid
attenuating lesion within the spleen measuring up to 6.5 Cm.

Musculoskeletal: No chest wall abnormality. No acute or significant
osseous findings.

Review of the MIP images confirms the above findings.
IMPRESSION: 1. No pulmonary embolus identified.
2. No acute pulmonary process.
3. Partially visualized cystic lesion within the pancreas measuring
up to 6.5 cm. The visualized portion of the lesion has benign
features. Complete evaluation with abdominal ultrasound or CT/MRI is
recommended.

By: Paulus N Ceejay M.D.

## 2019-04-08 ENCOUNTER — Emergency Department
Admission: EM | Admit: 2019-04-08 | Discharge: 2019-04-08 | Disposition: A | Discharge status: Home / Self Care | Payer: BC Managed Care – PPO | Attending: Emergency Medicine | Admitting: Emergency Medicine

## 2019-04-08 ENCOUNTER — Other Ambulatory Visit: Payer: Self-pay

## 2019-04-08 DIAGNOSIS — W2211XA Striking against or struck by driver side automobile airbag, initial encounter: Secondary | ICD-10-CM | POA: Diagnosis not present

## 2019-04-08 DIAGNOSIS — M7918 Myalgia, other site: Secondary | ICD-10-CM

## 2019-04-08 DIAGNOSIS — Y9389 Activity, other specified: Secondary | ICD-10-CM | POA: Diagnosis not present

## 2019-04-08 DIAGNOSIS — F17200 Nicotine dependence, unspecified, uncomplicated: Secondary | ICD-10-CM | POA: Diagnosis not present

## 2019-04-08 DIAGNOSIS — Y92410 Unspecified street and highway as the place of occurrence of the external cause: Secondary | ICD-10-CM | POA: Insufficient documentation

## 2019-04-08 DIAGNOSIS — Y999 Unspecified external cause status: Secondary | ICD-10-CM | POA: Diagnosis not present

## 2019-04-08 MED ORDER — TIZANIDINE HCL 4 MG PO TABS: 4.0000 mg | ORAL_TABLET | Freq: Four times a day (QID) | ORAL | 0 refills | Status: AC | PRN

## 2019-04-08 MED ORDER — KETOROLAC TROMETHAMINE 10 MG PO TABS: 10.0000 mg | ORAL_TABLET | Freq: Four times a day (QID) | ORAL | 0 refills | Status: DC | PRN

## 2019-04-08 MED ORDER — KETOROLAC TROMETHAMINE 30 MG/ML IJ SOLN
30.0000 mg | Freq: Once | INTRAMUSCULAR | Status: AC
  Administered 2019-04-08: 30 mg via INTRAMUSCULAR
  Filled 2019-04-08: qty 1

## 2019-04-08 MED ORDER — ORPHENADRINE CITRATE 30 MG/ML IJ SOLN
60.0000 mg | Freq: Two times a day (BID) | INTRAMUSCULAR | Status: DC
  Administered 2019-04-08: 60 mg via INTRAMUSCULAR
  Filled 2019-04-08: qty 2

## 2019-04-08 MED ORDER — OXYCODONE HCL 5 MG PO TABS
5.0000 mg | ORAL_TABLET | Freq: Once | ORAL | Status: AC
  Administered 2019-04-08: 5 mg via ORAL
  Filled 2019-04-08: qty 1

## 2019-04-08 MED ORDER — OXYCODONE-ACETAMINOPHEN 5-325 MG PO TABS: 1.0000 | ORAL_TABLET | Freq: Four times a day (QID) | ORAL | 0 refills | Status: DC | PRN

## 2019-04-08 NOTE — ED Notes (Signed)
Pt verbalized understanding of discharge instructions. NAD at this time. 
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
## 2019-04-18 ENCOUNTER — Other Ambulatory Visit: Payer: Self-pay

## 2019-05-09 ENCOUNTER — Ambulatory Visit: Payer: BC Managed Care – PPO | Admitting: Internal Medicine

## 2019-06-05 ENCOUNTER — Other Ambulatory Visit: Payer: Self-pay

## 2019-06-05 DIAGNOSIS — Z20822 Contact with and (suspected) exposure to covid-19: Secondary | ICD-10-CM

## 2019-06-06 LAB — NOVEL CORONAVIRUS, NAA: SARS-CoV-2, NAA: NOT DETECTED

## 2019-07-16 ENCOUNTER — Other Ambulatory Visit: Payer: Self-pay

## 2019-07-16 DIAGNOSIS — Z20822 Contact with and (suspected) exposure to covid-19: Secondary | ICD-10-CM

## 2019-07-19 ENCOUNTER — Ambulatory Visit: Payer: BC Managed Care – PPO | Admitting: Internal Medicine

## 2019-07-19 LAB — NOVEL CORONAVIRUS, NAA: SARS-CoV-2, NAA: NOT DETECTED

## 2020-04-29 ENCOUNTER — Encounter: Payer: Self-pay | Admitting: Certified Nurse Midwife

## 2020-04-29 ENCOUNTER — Other Ambulatory Visit: Payer: Self-pay

## 2020-04-29 ENCOUNTER — Ambulatory Visit (INDEPENDENT_AMBULATORY_CARE_PROVIDER_SITE_OTHER): Payer: BC Managed Care – PPO | Admitting: Certified Nurse Midwife

## 2020-04-29 VITALS — BP 106/67 | HR 84 | Ht 65.0 in | Wt 182.5 lb

## 2020-04-29 DIAGNOSIS — N393 Stress incontinence (female) (male): Secondary | ICD-10-CM | POA: Diagnosis not present

## 2020-04-29 NOTE — Patient Instructions (Signed)

## 2020-04-29 NOTE — Progress Notes (Signed)
GYN ENCOUNTER NOTE  Subjective:       Erica Mejia is a 44 y.o. G46P2000 female is here for gynecologic evaluation of the following issues:  1. Stress incontinence . Pt state she has had this issue since having her son 5 yrs ago ( 9 lbs baby). It has progressively gotten worse over the past 6 months. She has tried fluid restriction, Kegel exercises , and dietary changes, She denies smoking or drinking alcohol.  She state that whenever she cough, sneezes or has any big abdominal movement she leaks urine.    Gynecologic History Patient's last menstrual period was 04/16/2020 (approximate). Contraception: Depo-Provera injections Last Pap: 08/2014 Results were: normal Last mammogram:has not had   Obstetric History OB History  Gravida Para Term Preterm AB Living  2 2 2         SAB TAB Ectopic Multiple Live Births               # Outcome Date GA Lbr Len/2nd Weight Sex Delivery Anes PTL Lv  2 Term 05/23/15 [redacted]w[redacted]d   M Vag-Spont     1 Term 12/10/97 [redacted]w[redacted]d   M Vag-Spont       Past Medical History:  Diagnosis Date  . Anxiety disorder, unspecified   . Cholelithiasis   . Nicotine use disorder   . Recurrent UTI   . Splenic cyst   . Thoracic spondylosis     Past Surgical History:  Procedure Laterality Date  . APPENDECTOMY    . CHOLECYSTECTOMY N/A 08/23/2016   Procedure: LAPAROSCOPIC CHOLECYSTECTOMY;  Surgeon: 10/21/2016, MD;  Location: [facility] ORS;  Service: General;  Laterality: N/A;    Current Outpatient Medications on File Prior to Visit  Medication Sig Dispense Refill  . metoprolol tartrate (LOPRESSOR) 25 MG tablet Take 12.5 mg by mouth once.     No current facility-administered medications on file prior to visit.    No Known Allergies  Social History   Socioeconomic History  . Marital status: Divorced    Spouse name: Not on file  . Number of children: Not on file  . Years of education: Not on file  . Highest education level: Not on file  Occupational History  . Not on  file  Tobacco Use  . Smoking status: Current Every Day Smoker  . Smokeless tobacco: Never Used  Vaping Use  . Vaping Use: Former  Substance and Sexual Activity  . Alcohol use: No  . Drug use: No    Types: Marijuana    Comment: , no longer smokes MARIJUANA  . Sexual activity: Not Currently    Birth control/protection: Injection  Other Topics Concern  . Not on file  Social History Narrative  . Not on file   Social Determinants of Health   Financial Resource Strain:   . Difficulty of Paying Living Expenses: Not on file  Food Insecurity:   . Worried About [place] in the Last Year: Not on file  . Ran Out of Food in the Last Year: Not on file  Transportation Needs:   . Lack of Transportation (Medical): Not on file  . Lack of Transportation (Non-Medical): Not on file  Physical Activity:   . Days of Exercise per Week: Not on file  . Minutes of Exercise per Session: Not on file  Stress:   . Feeling of Stress : Not on file  Social Connections:   . Frequency of Communication with Friends and Family: Not on file  . Frequency of Social  Gatherings with Friends and Family: Not on file  . Attends Religious Services: Not on file  . Active Member of Clubs or Organizations: Not on file  . Attends Banker Meetings: Not on file  . Marital Status: Not on file  Intimate Partner Violence:   . Fear of Current or Ex-Partner: Not on file  . Emotionally Abused: Not on file  . Physically Abused: Not on file  . Sexually Abused: Not on file    Family History  Problem Relation Age of Onset  . Healthy Mother   . Healthy Father   . Hypertension Father   . Cancer Father        skin cancer  . Hypertension Maternal Grandmother   . Cancer Maternal Grandmother        colon cancer  . Hypertension Maternal Grandfather   . Diabetes Maternal Grandfather   . Stroke Maternal Grandfather   . Diabetes Paternal Grandfather   . Hypertension Paternal Grandfather     The  following portions of the patient's history were reviewed and updated as appropriate: allergies, current medications, past family history, past medical history, past social history, past surgical history and problem list.  Review of Systems Review of Systems - Negative except as mentioned in HPI Review of Systems - General ROS: negative for - chills, fatigue, fever, hot flashes, malaise or night sweats Hematological and Lymphatic ROS: negative for - bleeding problems or swollen lymph nodes Gastrointestinal ROS: negative for - abdominal pain, blood in stools, change in bowel habits and nausea/vomiting Musculoskeletal ROS: negative for - joint pain, muscle pain or muscular weakness Genito-Urinary ROS: negative for - change in menstrual cycle, dysmenorrhea, dyspareunia, dysuria, genital discharge, genital ulcers, hematuria,, irregular/heavy menses, nocturia or pelvic pain,. Positive for stress  Objective: incontinence   BP 106/67   Pulse 84   Ht 5\' 5"  (1.651 m)   Wt 182 lb 8 oz (82.8 kg)   LMP 04/16/2020 (Approximate)   BMI 30.37 kg/m  CONSTITUTIONAL: Well-developed, well-nourished female in no acute distress.  HENT:  Normocephalic, atraumatic.  NECK: Normal range of motion, supple, no masses.  Normal thyroid.  SKIN: Skin is warm and dry. No rash noted. Not diaphoretic. No erythema. No pallor. NEUROLGIC: Alert and oriented to person, place, and time. PSYCHIATRIC: Normal mood and affect. Normal behavior. Normal judgment and thought content. CARDIOVASCULAR:Not Examined RESPIRATORY: Not Examined BREASTS: Not Examined ABDOMEN: Soft, non distended; Non tender.  No Organomegaly. PELVIC:  External Genitalia: Normal  BUS: Normal  Vagina: Normal  Cervix: Normal  Uterus: Normal size, shape,consistency, mobile  Adnexa: Normal  RV: Normal   Bladder: Non tender, no presence of cystocele on exam  MUSCULOSKELETAL: Normal range of motion. No tenderness.  No cyanosis, clubbing, or  edema.     Assessment:   Stress incontinence    Plan:   Discussed use diary and bladder training, Information given. Orders placed for referral pelvic floor therapy. Discussed use of pessary if pelvic floor therapy does not improve symptoms , also discussed surgical options. Explained that there is not an approved medication to treat stress incontinence . Reviewed normal atrophy due to decreased estrogen with perimenopause. Discussed use of local estrogen to improve integrity for perimenopausal /menopausal pts. She is not currently having any symptoms of perimenopause . She declines estrogen at this time.  She verbalizes understanding and agrees to plan.   06/16/2020, CNM

## 2020-05-27 ENCOUNTER — Other Ambulatory Visit: Payer: Self-pay

## 2020-06-06 ENCOUNTER — Other Ambulatory Visit: Payer: Self-pay

## 2020-08-14 ENCOUNTER — Encounter: Payer: Self-pay | Admitting: Emergency Medicine

## 2020-08-14 ENCOUNTER — Other Ambulatory Visit: Payer: Self-pay

## 2020-08-14 ENCOUNTER — Emergency Department
Admission: EM | Admit: 2020-08-14 | Discharge: 2020-08-14 | Disposition: A | Discharge status: Home / Self Care | Payer: BC Managed Care – PPO | Attending: Emergency Medicine | Admitting: Emergency Medicine

## 2020-08-14 DIAGNOSIS — Z5321 Procedure and treatment not carried out due to patient leaving prior to being seen by health care provider: Secondary | ICD-10-CM | POA: Insufficient documentation

## 2020-08-14 DIAGNOSIS — M549 Dorsalgia, unspecified: Secondary | ICD-10-CM | POA: Insufficient documentation

## 2020-08-14 DIAGNOSIS — M79605 Pain in left leg: Secondary | ICD-10-CM | POA: Insufficient documentation

## 2020-08-14 DIAGNOSIS — M79604 Pain in right leg: Secondary | ICD-10-CM | POA: Insufficient documentation

## 2020-08-14 NOTE — ED Triage Notes (Signed)
Pt comes into the ED via POV c/o generalized back pain and bilateral leg pain.  Pt denies any injury but states that at night her legs throb.  Pt states the pain is better when she is sitting vs. Lying down.  Pt ambulatory to triage at this time and in NAD. Pt states she also had a fever for 2 days but is currently afebrile. Denies any urinary symptoms as well.

## 2021-12-11 ENCOUNTER — Telehealth: Payer: Self-pay

## 2021-12-11 NOTE — Telephone Encounter (Signed)
Ok to schedule.

## 2021-12-11 NOTE — Telephone Encounter (Signed)
Pt would like to become a new patient. Erica Mejia is a pt of the provider ?

## 2022-01-07 ENCOUNTER — Ambulatory Visit (INDEPENDENT_AMBULATORY_CARE_PROVIDER_SITE_OTHER): Payer: PRIVATE HEALTH INSURANCE | Admitting: Internal Medicine

## 2022-01-07 ENCOUNTER — Encounter: Payer: Self-pay | Admitting: Internal Medicine

## 2022-01-07 VITALS — BP 120/80 | HR 79 | Temp 98.2°F | Resp 12 | Ht 65.0 in | Wt 187.8 lb

## 2022-01-07 DIAGNOSIS — N393 Stress incontinence (female) (male): Secondary | ICD-10-CM | POA: Diagnosis not present

## 2022-01-07 DIAGNOSIS — Z113 Encounter for screening for infections with a predominantly sexual mode of transmission: Secondary | ICD-10-CM

## 2022-01-07 DIAGNOSIS — Z780 Asymptomatic menopausal state: Secondary | ICD-10-CM | POA: Diagnosis not present

## 2022-01-07 DIAGNOSIS — E538 Deficiency of other specified B group vitamins: Secondary | ICD-10-CM | POA: Diagnosis not present

## 2022-01-07 DIAGNOSIS — E559 Vitamin D deficiency, unspecified: Secondary | ICD-10-CM | POA: Diagnosis not present

## 2022-01-07 DIAGNOSIS — Z13818 Encounter for screening for other digestive system disorders: Secondary | ICD-10-CM

## 2022-01-07 DIAGNOSIS — Z124 Encounter for screening for malignant neoplasm of cervix: Secondary | ICD-10-CM

## 2022-01-07 DIAGNOSIS — I493 Ventricular premature depolarization: Secondary | ICD-10-CM

## 2022-01-07 DIAGNOSIS — Z1283 Encounter for screening for malignant neoplasm of skin: Secondary | ICD-10-CM

## 2022-01-07 DIAGNOSIS — Z0000 Encounter for general adult medical examination without abnormal findings: Secondary | ICD-10-CM

## 2022-01-07 DIAGNOSIS — Z1211 Encounter for screening for malignant neoplasm of colon: Secondary | ICD-10-CM

## 2022-01-07 DIAGNOSIS — Z13220 Encounter for screening for lipoid disorders: Secondary | ICD-10-CM | POA: Diagnosis not present

## 2022-01-07 DIAGNOSIS — M7989 Other specified soft tissue disorders: Secondary | ICD-10-CM

## 2022-01-07 DIAGNOSIS — E611 Iron deficiency: Secondary | ICD-10-CM

## 2022-01-07 DIAGNOSIS — R319 Hematuria, unspecified: Secondary | ICD-10-CM

## 2022-01-07 DIAGNOSIS — R5383 Other fatigue: Secondary | ICD-10-CM | POA: Diagnosis not present

## 2022-01-07 DIAGNOSIS — N938 Other specified abnormal uterine and vaginal bleeding: Secondary | ICD-10-CM

## 2022-01-07 DIAGNOSIS — Z1231 Encounter for screening mammogram for malignant neoplasm of breast: Secondary | ICD-10-CM

## 2022-01-07 LAB — LIPID PANEL
Cholesterol: 172 mg/dL (ref 0–200)
HDL: 56.2 mg/dL (ref >39.00)
LDL Cholesterol: 99 mg/dL (ref 0–99)
NonHDL: 116.19
Total CHOL/HDL Ratio: 3
Triglycerides: 85 mg/dL (ref 0.0–149.0)
VLDL: 17 mg/dL (ref 0.0–40.0)

## 2022-01-07 LAB — CBC WITH DIFFERENTIAL/PLATELET
Basophils Absolute: 0 10*3/uL (ref 0.0–0.1)
Basophils Relative: 0.7 % (ref 0.0–3.0)
Eosinophils Absolute: 0.3 10*3/uL (ref 0.0–0.7)
Eosinophils Relative: 4.4 % (ref 0.0–5.0)
HCT: 39.6 % (ref 36.0–46.0)
Hemoglobin: 13.5 g/dL (ref 12.0–15.0)
Lymphocytes Relative: 38.1 % (ref 12.0–46.0)
Lymphs Abs: 2.2 10*3/uL (ref 0.7–4.0)
MCHC: 34.2 g/dL (ref 30.0–36.0)
MCV: 93.7 fl (ref 78.0–100.0)
Monocytes Absolute: 0.4 10*3/uL (ref 0.1–1.0)
Monocytes Relative: 7.5 % (ref 3.0–12.0)
Neutro Abs: 2.8 10*3/uL (ref 1.4–7.7)
Neutrophils Relative %: 49.3 % (ref 43.0–77.0)
Platelets: 246 10*3/uL (ref 150.0–400.0)
RBC: 4.23 Mil/uL (ref 3.87–5.11)
RDW: 12.7 % (ref 11.5–15.5)
WBC: 5.7 10*3/uL (ref 4.0–10.5)

## 2022-01-07 LAB — COMPREHENSIVE METABOLIC PANEL
ALT: 18 U/L (ref 0–35)
AST: 18 U/L (ref 0–37)
Albumin: 4.4 g/dL (ref 3.5–5.2)
Alkaline Phosphatase: 53 U/L (ref 39–117)
BUN: 10 mg/dL (ref 6–23)
CO2: 28 mEq/L (ref 19–32)
Calcium: 9.3 mg/dL (ref 8.4–10.5)
Chloride: 107 mEq/L (ref 96–112)
Creatinine, Ser: 0.65 mg/dL (ref 0.40–1.20)
GFR: 106.51 mL/min (ref >60.00)
Glucose, Bld: 84 mg/dL (ref 70–99)
Potassium: 4.6 mEq/L (ref 3.5–5.1)
Sodium: 141 mEq/L (ref 135–145)
Total Bilirubin: 0.6 mg/dL (ref 0.2–1.2)
Total Protein: 6.6 g/dL (ref 6.0–8.3)

## 2022-01-07 LAB — IBC + FERRITIN
Ferritin: 16.6 ng/mL (ref 10.0–291.0)
Iron: 94 ug/dL (ref 42–145)
Saturation Ratios: 26 % (ref 20.0–50.0)
TIBC: 361.2 ug/dL (ref 250.0–450.0)
Transferrin: 258 mg/dL (ref 212.0–360.0)

## 2022-01-07 LAB — VITAMIN B12: Vitamin B-12: 320 pg/mL (ref 211–911)

## 2022-01-07 LAB — FOLLICLE STIMULATING HORMONE: FSH: 20.2 m[IU]/mL

## 2022-01-07 LAB — TSH: TSH: 2.81 u[IU]/mL (ref 0.35–5.50)

## 2022-01-07 LAB — VITAMIN D 25 HYDROXY (VIT D DEFICIENCY, FRACTURES): VITD: 26.12 ng/mL — ABNORMAL LOW (ref 30.00–100.00)

## 2022-01-07 NOTE — Progress Notes (Signed)
Chief Complaint  Patient presents with   Establish Care    Disc about ankle swelling after working 12 hr shifts. Has heart hx for the past 5 yrs. Disc menopause, fam hx of breast cancer,skin cancer & colon cancer.    Annual Exam   Annual  1. Leg swelling after 12 hours shifts at [employer] chronically  2. C/o DUB and menopause sxs mom just at 44 dxed with breast cancer and fh colon cancer wants ob/gyn colonoscopy and mammogram  3. H/o pvcs prev saw Dr Welton [facility]  4. C/o fatigue   Review of Systems  Constitutional:  Positive for malaise/fatigue. Negative for weight loss.  HENT:  Negative for hearing loss.   Eyes:  Negative for blurred vision.  Respiratory:  Negative for shortness of breath.   Cardiovascular:  Negative for chest pain.  Gastrointestinal:  Negative for abdominal pain and blood in stool.  Genitourinary:  Negative for dysuria.  Musculoskeletal:  Negative for falls and joint pain.  Skin:  Negative for rash.  Neurological:  Negative for headaches.  Psychiatric/Behavioral:  Negative for depression.   Past Medical History:  Diagnosis Date   Alcohol abuse    quit as of 12/2021   Anxiety disorder, unspecified    Chicken pox    Cholelithiasis    Nicotine use disorder    quit 1 year as of 1 year 12/2020   PVC's (premature ventricular contractions)    Recurrent UTI    Splenic cyst    Thoracic spondylosis    Past Surgical History:  Procedure Laterality Date   APPENDECTOMY     CHOLECYSTECTOMY N/A 08/23/2016   Procedure: LAPAROSCOPIC CHOLECYSTECTOMY;  Surgeon: Leafy Ro, MD;  Location: [facility] ORS;  Service: General;  Laterality: N/A;   spleen cyst removal spleen still intact 8 cm cyst     Family History  Problem Relation Age of Onset   Hypertension Mother    Hyperlipidemia Mother    Pulmonary embolism Mother        07/2021 and IVC clot   Cancer Mother        breast cancer age 83 genetic testing negative   Healthy Mother    Ovarian cysts Mother    Hypertension Father     Cancer Father        skin cancer   Alcohol abuse Father        former   Coronary artery disease Father        no stents   Hypertension Maternal Grandmother    Cancer Maternal Grandmother        colon cancer   Hypertension Maternal Grandfather    Diabetes Maternal Grandfather    Stroke Maternal Grandfather    Cancer Paternal Grandfather        colon   Diabetes Paternal Grandfather    Hypertension Paternal Grandfather    Asthma Son    Social History   Socioeconomic History   Marital status: Divorced    Spouse name: Not on file   Number of children: Not on file   Years of education: Not on file   Highest education level: Not on file  Occupational History   Not on file  Tobacco Use   Smoking status: Former    Types: Cigarettes   Smokeless tobacco: Never   Tobacco comments:    Quit smoking 1 yr   Vaping Use   Vaping Use: Former  Substance and Sexual Activity   Alcohol use: No   Drug use: No  Types: Marijuana    Comment: , no longer smokes MARIJUANA   Sexual activity: Not Currently    Birth control/protection: Injection  Other Topics Concern   Not on file  Social History Narrative   [employer]   Single 2 son 2 y.o and 67 y.o as of 01/07/22    Nigh shifts       Social Determinants of Health   Financial Resource Strain: Not on file  Food Insecurity: Not on file  Transportation Needs: Not on file  Physical Activity: Not on file  Stress: Not on file  Social Connections: Not on file  Intimate Partner Violence: Not on file   Current Meds  Medication Sig   metoprolol tartrate (LOPRESSOR) 25 MG tablet Take 12.5 mg by mouth once.   No Known Allergies No results found for this or any previous visit (from the past 2160 hour(s)). Objective  Body mass index is 31.25 kg/m. Wt Readings from Last 3 Encounters:  01/07/22 187 lb 12.8 oz (85.2 kg)  08/14/20 180 lb (81.6 kg)  04/29/20 182 lb 8 oz (82.8 kg)   Temp Readings from Last 3 Encounters:   01/07/22 98.2 F (36.8 C) (Oral)  08/14/20 98.6 F (37 C) (Oral)  04/08/19 99.2 F (37.3 C)   BP Readings from Last 3 Encounters:  01/07/22 120/80  08/14/20 119/83  04/29/20 106/67   Pulse Readings from Last 3 Encounters:  01/07/22 79  08/14/20 83  04/29/20 84    Physical Exam Vitals and nursing note reviewed.  Constitutional:      Appearance: Normal appearance. She is well-developed and well-groomed.  HENT:     Head: Normocephalic and atraumatic.  Eyes:     Conjunctiva/sclera: Conjunctivae normal.     Pupils: Pupils are equal, round, and reactive to light.  Cardiovascular:     Rate and Rhythm: Normal rate and regular rhythm.     Heart sounds: Normal heart sounds. No murmur heard. Pulmonary:     Effort: Pulmonary effort is normal.     Breath sounds: Normal breath sounds.  Abdominal:     General: Abdomen is flat. Bowel sounds are normal.     Tenderness: There is no abdominal tenderness.  Musculoskeletal:        General: No tenderness.  Skin:    General: Skin is warm and dry.  Neurological:     General: No focal deficit present.     Mental Status: She is alert and oriented to person, place, and time. Mental status is at baseline.     Cranial Nerves: Cranial nerves 2-12 are intact.     Motor: Motor function is intact.     Coordination: Coordination is intact.     Gait: Gait is intact.  Psychiatric:        Attention and Perception: Attention and perception normal.        Mood and Affect: Mood and affect normal.        Speech: Speech normal.        Behavior: Behavior normal. Behavior is cooperative.        Thought Content: Thought content normal.        Cognition and Memory: Cognition and memory normal.        Judgment: Judgment normal.    Assessment  Plan  Annual physical exam See below   Stress incontinence disc kegels  Fatigue, unspecified type - Plan: Comprehensive metabolic panel, CBC with Differential/Platelet, TSH, Urinalysis, Routine w reflex  microscopic Vitamin D deficiency - Plan:  Vitamin D (25 hydroxy) B12 deficiency - Plan: Vitamin B12  Menopause - Plan: Follicle stimulating hormone  Iron deficiency - Plan: IBC + Ferritin  Leg swelling Wear compression stockings while at work and elevated  DUB (dysfunctional uterine bleeding) Routine cervical smear Let me know where wants to go to ob/gyn [facility] my chart me back   Skin cancer screening - Plan: Ambulatory referral to [facility]  PVC (premature ventricular contraction)  Has seen cards in the past had echo, holter and stress test Dr.Shaukat khan records requested today   Hm Flu shot 2022 Tdap 02/20/15 Covid shots 2/2 declines  Prevnar 13 utd   Mammogram referred [facility]   Colonoscopy referred [facility]   [facility] call back with name for referral  Referred [facility]  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 French Anaracy McLean-Scoc[facility]

## 2022-01-07 NOTE — Patient Instructions (Addendum)
[facility] for colonoscopy   Let me know about [facility] ob/gyn   Call to schedule mammogram  [employer] compression coppertone   Edema  Edema is an abnormal buildup of fluids in the body tissues and under the skin. Swelling of the legs, feet, and ankles is a common symptom that becomes more likely as you get older. Swelling is also common in looser tissues, such as around the eyes. Pressing on the area may make a temporary dent in your skin (pitting edema). This fluid may also accumulate in your lungs (pulmonary edema). There are many possible causes of edema. Eating too much salt (sodium) and being on your feet or sitting for a long time can cause edema in your legs, feet, and ankles. Common causes of edema include: Certain medical conditions, such as heart failure, liver or kidney disease, and cancer. Weak leg blood vessels. An injury. Pregnancy. Medicines. Being obese. Low protein levels in the blood. Hot weather may make edema worse. Edema is usually painless. Your skin may look swollen or shiny. Follow these instructions at home: Medicines Take over-the-counter and prescription medicines only as told by your health care provider. Your health care provider may prescribe a medicine to help your body get rid of extra water (diuretic). Take this medicine if you are told to take it. Eating and drinking Eat a low-salt (low-sodium) diet to reduce fluid as told by your health care provider. Sometimes, eating less salt may reduce swelling. Depending on the cause of your swelling, you may need to limit how much fluid you drink (fluid restriction). General instructions Raise (elevate) the injured area above the level of your heart while you are sitting or lying down. Do not sit still or stand for long periods of time. Do not wear tight clothing. Do not wear garters on your upper legs. Exercise your legs to get your circulation going. This helps to move the fluid back into your blood vessels,  and it may help the swelling go down. Wear compression stockings as told by your health care provider. These stockings help to prevent blood clots and reduce swelling in your legs. It is important that these are the correct size. These stockings should be prescribed by your health care provider to prevent possible injuries. If elastic bandages or wraps are recommended, use them as told by your health care provider. Contact a health care provider if: Your edema does not get better with treatment. You have heart, liver, or kidney disease and have symptoms of edema. You have sudden and unexplained weight gain. Get help right away if: You develop shortness of breath or chest pain. You cannot breathe when you lie down. You develop pain, redness, or warmth in the swollen areas. You have heart, liver, or kidney disease and suddenly get edema. You have a fever and your symptoms suddenly get worse. These symptoms may be an emergency. Get help right away. Call 911. Do not wait to see if the symptoms will go away. Do not drive yourself to the hospital. Summary Edema is an abnormal buildup of fluids in the body tissues and under the skin. Eating too much salt (sodium)and being on your feet or sitting for a long time can cause edema in your legs, feet, and ankles. Raise (elevate) the injured area above the level of your heart while you are sitting or lying down. Follow your health care provider's instructions about diet and how much fluid you can drink. This information is not intended to replace advice given  to you by your health care provider. Make sure you discuss any questions you have with your health care provider. Document Revised: 04/06/2021 Document Reviewed: 04/06/2021 Elsevier Patient Education  2023 Elsevier Inc.  Premature Ventricular Contraction  A premature ventricular contraction (PVC) is a common kind of irregular heartbeat (arrhythmia). These contractions are extra heartbeats that  start in the ventricles of the heart and occur too early in the normal sequence. During the PVC, the heart's normal electrical pathway is not used, so the beat is shorter and less effective. In most cases, these contractions come and go and do not require treatment. What are the causes? Common causes of the condition include: Smoking. Drinking alcohol. Certain medicines. Some illegal drugs. Stress. Caffeine. Certain medical conditions can also cause PVCs: Heart failure. Heart attack, or coronary artery disease. Heart valve problems. Changes in minerals in the blood (electrolytes). Low blood oxygen levels or high carbon dioxide levels. In many cases, the cause of this condition is not known. What are the signs or symptoms? The main symptom of this condition is fast or skipped heartbeats (palpitations). Other symptoms include: Chest pain. Shortness of breath. Feeling tired. Dizziness. Difficulty exercising. In some cases, there are no symptoms. How is this diagnosed? This condition may be diagnosed based on: Your medical history. A physical exam. During the exam, the health care provider will check for irregular heartbeats. Tests, such as: An ECG (electrocardiogram) to monitor the electrical activity of your heart. An ambulatory cardiac monitor. This device records your heartbeats for 24 hours or more. Stress tests to see how exercise affects your heart rhythm and blood supply. An echocardiogram. This test uses sound waves (ultrasound) to produce an image of your heart. An electrophysiology study (EPS). This test checks for electrical problems in your heart. How is this treated? Treatment for this condition depends on any underlying conditions, the type of PVCs that you are having, and how much the symptoms are interfering with your daily life. Possible treatments include: Avoiding things that cause premature contractions (triggers). These include caffeine and alcohol. Taking  medicines if symptoms are severe or if the extra heartbeats are frequent. Getting treatment for underlying conditions that cause PVCs. Having an implantable cardioverter defibrillator (ICD), if you are at risk for a serious arrhythmia. The ICD is a small device that is inserted into your chest to monitor your heartbeat. When it senses an irregular heartbeat, it sends a shock to bring the heartbeat back to normal. Having a procedure to destroy the portion of the heart tissue that sends out abnormal signals (catheter ablation). In some cases, no treatment is required. Follow these instructions at home: Lifestyle Do not use any products that contain nicotine or tobacco, such as cigarettes, e-cigarettes, and chewing tobacco. If you need help quitting, ask your health care provider. Do not use illegal drugs. Exercise regularly. Ask your health care provider what type of exercise is safe for you. Try to get at least 7-9 hours of sleep each night, or as much as recommended by your health care provider. Find healthy ways to manage stress. Avoid stressful situations when possible. Alcohol use Do not drink alcohol if: Your health care provider tells you not to drink. You are pregnant, may be pregnant, or are planning to become pregnant. Alcohol triggers your episodes. If you drink alcohol: Limit how much you use to: 0-1 drink a day for women. 0-2 drinks a day for men. Be aware of how much alcohol is in your drink. In the [place]  one drink equals one 12 oz bottle of beer (355 mL), one 5 oz glass of wine (148 mL), or one 1 oz glass of hard liquor (44 mL). General instructions Take over-the-counter and prescription medicines only as told by your health care provider. If caffeine triggers episodes of PVC, do not eat, drink, or use anything with caffeine in it. Keep all follow-up visits as told by your health care provider. This is important. Contact a health care provider if you: Feel palpitations. Get  help right away if you: Have chest pain. Have shortness of breath. Have sweating for no reason. Have nausea and vomiting. Become light-headed or you faint. Summary A premature ventricular contraction (PVC) is a common kind of irregular heartbeat (arrhythmia). In most cases, these contractions come and go and do not require treatment. You may need to wear an ambulatory cardiac monitor. This records your heartbeats for 24 hours or more. Treatment depends on any underlying conditions, the type of PVCs that you are having, and how much the symptoms are interfering with your daily life. This information is not intended to replace advice given to you by your health care provider. Make sure you discuss any questions you have with your health care provider. Document Revised: 12/30/2020 Document Reviewed: 12/30/2020 Elsevier Patient Education  2023 Elsevier Inc.   Palpitations Palpitations are feelings that your heartbeat is irregular or is faster than normal. It may feel like your heart is fluttering or skipping a beat. Palpitations may be caused by many things, including smoking, caffeine, alcohol, stress, and certain medicines or drugs. Most causes of palpitations are not serious.  However, some palpitations can be a sign of a serious problem. Further tests and a thorough medical history will be done to find the cause of your palpitations. Your provider may order tests such as an ECG, labs, an echocardiogram, or an ambulatory continuous ECG monitor. Follow these instructions at home: Pay attention to any changes in your symptoms. Let your health care provider know about them. Take these actions to help manage your symptoms: Eating and drinking Follow instructions from your health care provider about eating or drinking restrictions. You may need to avoid foods and drinks that may cause palpitations. These may include: Caffeinated coffee, tea, soft drinks, and energy  drinks. Chocolate. Alcohol. Diet pills. Lifestyle     Take steps to reduce your stress and anxiety. Things that can help you relax include: Yoga. Mind-body activities, such as deep breathing, meditation, or using words and images to create positive thoughts (guided imagery). Physical activity, such as swimming, jogging, or walking. Tell your health care provider if your palpitations increase with activity. If you have chest pain or shortness of breath with activity, do not continue the activity until you are seen by your health care provider. Biofeedback. This is a method that helps you learn to use your mind to control things in your body, such as your heartbeat. Get plenty of rest and sleep. Keep a regular bed time. Do not use drugs, including cocaine or ecstasy. Do not use marijuana. Do not use any products that contain nicotine or tobacco. These products include cigarettes, chewing tobacco, and vaping devices, such as e-cigarettes. If you need help quitting, ask your health care provider. General instructions Take over-the-counter and prescription medicines only as told by your health care provider. Keep all follow-up visits. This is important. These may include visits for further testing if palpitations do not go away or get worse. Contact a health care provider if:  You continue to have a fast or irregular heartbeat for a long period of time. You notice that your palpitations occur more often. Get help right away if: You have chest pain or shortness of breath. You have a severe headache. You feel dizzy or you faint. These symptoms may represent a serious problem that is an emergency. Do not wait to see if the symptoms will go away. Get medical help right away. Call your local emergency services (911 in the [place] Do not drive yourself to the hospital. Summary Palpitations are feelings that your heartbeat is irregular or is faster than normal. It may feel like your heart is fluttering  or skipping a beat. Palpitations may be caused by many things, including smoking, caffeine, alcohol, stress, certain medicines, and drugs. Further tests and a thorough medical history may be done to find the cause of your palpitations. Get help right away if you faint or have chest pain, shortness of breath, severe headache, or dizziness. This information is not intended to replace advice given to you by your health care provider. Make sure you discuss any questions you have with your health care provider. Document Revised: 12/24/2020 Document Reviewed: 12/24/2020 Elsevier Patient Education  2023 [employer]

## 2022-01-08 LAB — URINALYSIS, ROUTINE W REFLEX MICROSCOPIC
Bacteria, UA: NONE SEEN /HPF
Bilirubin Urine: NEGATIVE
Glucose, UA: NEGATIVE
Hyaline Cast: NONE SEEN /LPF
Ketones, ur: NEGATIVE
Nitrite: NEGATIVE
Specific Gravity, Urine: 1.02 (ref 1.001–1.035)
pH: 6 (ref 5.0–8.0)

## 2022-01-08 LAB — MICROSCOPIC MESSAGE

## 2022-01-08 LAB — URINE CULTURE
MICRO NUMBER:: 13445313
Result:: NO GROWTH
SPECIMEN QUALITY:: ADEQUATE

## 2022-01-08 LAB — HEPATITIS C ANTIBODY
Hepatitis C Ab: NONREACTIVE
SIGNAL TO CUT-OFF: 0.12 (ref <1.00)

## 2022-01-08 LAB — HIV ANTIBODY (ROUTINE TESTING W REFLEX): HIV 1&2 Ab, 4th Generation: NONREACTIVE

## 2022-01-11 ENCOUNTER — Encounter: Payer: Self-pay | Admitting: Internal Medicine

## 2022-01-12 ENCOUNTER — Encounter: Payer: Self-pay | Admitting: Internal Medicine

## 2022-01-12 NOTE — Addendum Note (Signed)
Addended by: Quentin Ore on: 01/12/2022 05:53 PM   Modules accepted: Orders

## 2022-04-14 ENCOUNTER — Telehealth: Payer: Self-pay | Admitting: Internal Medicine

## 2022-04-14 ENCOUNTER — Ambulatory Visit: Payer: PRIVATE HEALTH INSURANCE | Admitting: Internal Medicine

## 2022-04-14 NOTE — Telephone Encounter (Signed)
Entered in error pt did not show for appt today not sure if cancelled Reschedule or TOC Dr .[place]

## 2022-05-20 ENCOUNTER — Ambulatory Visit: Payer: Self-pay

## 2022-06-30 ENCOUNTER — Ambulatory Visit: Payer: PRIVATE HEALTH INSURANCE | Admitting: Family Medicine

## 2022-07-22 ENCOUNTER — Ambulatory Visit: Payer: PRIVATE HEALTH INSURANCE | Admitting: Dermatology

## 2022-08-13 ENCOUNTER — Encounter: Payer: Self-pay | Admitting: Family Medicine

## 2022-10-19 ENCOUNTER — Telehealth: Payer: Self-pay

## 2022-10-19 NOTE — Progress Notes (Unsigned)
  Tomasita Morrow, NP-C Phone: [phone]  Erica Mejia is a 47 y.o. female who presents today for UTI symptoms.  UTI: Dysuria- *** Frequency- ***  Urgency- ***  Hematuria- ***  Fever- *** Abd pain- ***  Vaginal d/c- ***   Social History   Tobacco Use  Smoking Status Former   Types: Cigarettes  Smokeless Tobacco Never  Tobacco Comments   Quit smoking 1 yr     Current Outpatient Medications on File Prior to Visit  Medication Sig Dispense Refill   metoprolol tartrate (LOPRESSOR) 25 MG tablet Take 12.5 mg by mouth once.     No current facility-administered medications on file prior to visit.     ROS see history of present illness  Objective  Physical Exam There were no vitals filed for this visit.  BP Readings from Last 3 Encounters:  01/07/22 120/80  08/14/20 119/83  04/29/20 106/67   Wt Readings from Last 3 Encounters:  01/07/22 187 lb 12.8 oz (85.2 kg)  08/14/20 180 lb (81.6 kg)  04/29/20 182 lb 8 oz (82.8 kg)    Physical Exam   Assessment/Plan: Please see individual problem list.  There are no diagnoses linked to this encounter.   Health Maintenance: ***  No follow-ups on file.   Tomasita Morrow, NP-C [place]

## 2022-10-19 NOTE — Telephone Encounter (Signed)
Called and spoke with pt to go over meds list and confirm pharm and check on symptoms.   Pt was also advised taht a urine sample would be collected during the appt.

## 2022-10-20 ENCOUNTER — Ambulatory Visit (INDEPENDENT_AMBULATORY_CARE_PROVIDER_SITE_OTHER): Payer: PRIVATE HEALTH INSURANCE | Admitting: Nurse Practitioner

## 2022-10-20 ENCOUNTER — Encounter: Payer: Self-pay | Admitting: Nurse Practitioner

## 2022-10-20 VITALS — BP 118/78 | HR 87 | Temp 98.2°F | Ht 65.0 in | Wt 186.4 lb

## 2022-10-20 DIAGNOSIS — R300 Dysuria: Secondary | ICD-10-CM | POA: Diagnosis not present

## 2022-10-20 DIAGNOSIS — N3001 Acute cystitis with hematuria: Secondary | ICD-10-CM

## 2022-10-20 LAB — URINALYSIS, ROUTINE W REFLEX MICROSCOPIC
Bilirubin Urine: NEGATIVE
Ketones, ur: NEGATIVE
Nitrite: NEGATIVE
Specific Gravity, Urine: >=1.03 — AB (ref 1.000–1.030)
Total Protein, Urine: NEGATIVE
Urine Glucose: NEGATIVE
Urobilinogen, UA: 0.2 (ref 0.0–1.0)
pH: 6 (ref 5.0–8.0)

## 2022-10-20 LAB — POC URINALSYSI DIPSTICK (AUTOMATED)
Bilirubin, UA: NEGATIVE
Glucose, UA: NEGATIVE
Ketones, UA: NEGATIVE
Nitrite, UA: NEGATIVE
Protein, UA: POSITIVE — AB
Spec Grav, UA: >=1.03 — AB (ref 1.010–1.025)
Urobilinogen, UA: 0.2 E.U./dL
pH, UA: 6 (ref 5.0–8.0)

## 2022-10-20 MED ORDER — NITROFURANTOIN MONOHYD MACRO 100 MG PO CAPS: 100.0000 mg | ORAL_CAPSULE | Freq: Two times a day (BID) | ORAL | 0 refills | Status: DC

## 2022-10-20 NOTE — Assessment & Plan Note (Signed)
UA in office with large leukocytes, protein and small blood. Microscopic UA and culture pending. Will contact patient with results. Will start patient on Macrobid 100 BID x 5 days. Encouraged adequate fluid intake.

## 2022-10-23 LAB — URINE CULTURE
MICRO NUMBER:: 14657061
SPECIMEN QUALITY:: ADEQUATE

## 2022-11-03 ENCOUNTER — Other Ambulatory Visit: Payer: Self-pay

## 2022-11-11 ENCOUNTER — Encounter: Payer: PRIVATE HEALTH INSURANCE | Admitting: Family Medicine

## 2022-12-13 NOTE — Progress Notes (Deleted)
  Bethanie Dicker, NP-C Phone: [phone]  Erica Mejia is a 47 y.o. female who presents today for transfer of care.   ***  Social History   Tobacco Use  Smoking Status Former   Types: Cigarettes  Smokeless Tobacco Never  Tobacco Comments   Quit smoking 1 yr     Current Outpatient Medications on File Prior to Visit  Medication Sig Dispense Refill   metoprolol tartrate (LOPRESSOR) 25 MG tablet Take 12.5 mg by mouth once. (Patient not taking: Reported on 10/20/2022)     nitrofurantoin, macrocrystal-monohydrate, (MACROBID) 100 MG capsule Take 1 capsule (100 mg total) by mouth 2 (two) times daily. 10 capsule 0   sertraline (ZOLOFT) 50 MG tablet Take 1 tablet by mouth daily.     No current facility-administered medications on file prior to visit.     ROS see history of present illness  Objective  Physical Exam There were no vitals filed for this visit.  BP Readings from Last 3 Encounters:  10/20/22 118/78  01/07/22 120/80  08/14/20 119/83   Wt Readings from Last 3 Encounters:  10/20/22 186 lb 6.4 oz (84.6 kg)  01/07/22 187 lb 12.8 oz (85.2 kg)  08/14/20 180 lb (81.6 kg)    Physical Exam   Assessment/Plan: Please see individual problem list.  There are no diagnoses linked to this encounter.   Health Maintenance: ***  No follow-ups on file.   Bethanie Dicker, NP-C [facility] [employer]

## 2022-12-14 ENCOUNTER — Encounter: Payer: PRIVATE HEALTH INSURANCE | Admitting: Nurse Practitioner

## 2023-01-19 ENCOUNTER — Ambulatory Visit: Payer: PRIVATE HEALTH INSURANCE | Admitting: Nurse Practitioner

## 2023-01-24 ENCOUNTER — Encounter: Payer: Self-pay | Admitting: Nurse Practitioner

## 2023-01-24 ENCOUNTER — Ambulatory Visit (INDEPENDENT_AMBULATORY_CARE_PROVIDER_SITE_OTHER): Payer: PRIVATE HEALTH INSURANCE | Admitting: Nurse Practitioner

## 2023-01-24 VITALS — BP 116/64 | HR 91 | Temp 98.4°F | Ht 65.0 in | Wt 180.2 lb

## 2023-01-24 DIAGNOSIS — H1032 Unspecified acute conjunctivitis, left eye: Secondary | ICD-10-CM | POA: Diagnosis not present

## 2023-01-24 DIAGNOSIS — B372 Candidiasis of skin and nail: Secondary | ICD-10-CM | POA: Diagnosis not present

## 2023-01-24 MED ORDER — FLUCONAZOLE 150 MG PO TABS: 150.0000 mg | ORAL_TABLET | Freq: Once | ORAL | 0 refills | Status: AC

## 2023-01-24 MED ORDER — NYSTATIN 100000 UNIT/GM EX POWD: 1.0000 | Freq: Three times a day (TID) | CUTANEOUS | 1 refills | Status: AC

## 2023-01-24 MED ORDER — NEOMYCIN-POLYMYXIN-DEXAMETH 3.5-10000-0.1 OP SUSP: 1.0000 [drp] | Freq: Four times a day (QID) | OPHTHALMIC | 0 refills | Status: DC

## 2023-01-24 NOTE — Progress Notes (Unsigned)
Bethanie Dicker, NP-C Phone: [phone]  Crystalina Avallone is a 47 y.o. female who presents today for rash and pink eye.  Conjunctivitis- Patient woke this morning and her left eye was extremely red, inflamed, and crusted shut. She reports thick, yellow discharge. Denies pain. Denies vision changes.   Rash- Patient believes she has a topical yeast infection under her right breast. She has been using Nystatin powder and Clotrimazole x 1 week without relief in symptoms. It is very tender, pruritic, red and irritated.  Social History   Tobacco Use  Smoking Status Former   Types: Cigarettes  Smokeless Tobacco Never  Tobacco Comments   Quit smoking 1 yr     No current outpatient medications on file prior to visit.   No current facility-administered medications on file prior to visit.    ROS see history of present illness  Objective  Physical Exam Vitals:   01/24/23 0841  BP: 116/64  Pulse: 91  Temp: 98.4 F (36.9 C)  SpO2: 97%    BP Readings from Last 3 Encounters:  01/24/23 116/64  10/20/22 118/78  01/07/22 120/80   Wt Readings from Last 3 Encounters:  01/24/23 180 lb 3.2 oz (81.7 kg)  10/20/22 186 lb 6.4 oz (84.6 kg)  01/07/22 187 lb 12.8 oz (85.2 kg)    Physical Exam Constitutional:      General: She is not in acute distress.    Appearance: Normal appearance.  HENT:     Head: Normocephalic.  Eyes:     General: Lids are normal.        Left eye: Discharge present.    Conjunctiva/sclera:     Left eye: Left conjunctiva is injected. Exudate present.     Pupils: Pupils are equal, round, and reactive to light.  Cardiovascular:     Rate and Rhythm: Normal rate and regular rhythm.     Heart sounds: Normal heart sounds.  Pulmonary:     Effort: Pulmonary effort is normal.     Breath sounds: Normal breath sounds.  Chest:     Comments: Area under right breast fold- erythematous, raw, and inflamed. Tenderness present.  Skin:    General: Skin is warm and dry.   Neurological:     General: No focal deficit present.     Mental Status: She is alert.  Psychiatric:        Mood and Affect: Mood normal.        Behavior: Behavior normal.    Assessment/Plan: Please see individual problem list.  Acute bacterial conjunctivitis of left eye Assessment & Plan: Consistent with conjunctivitis. Will treat with Maxitrol. Advised frequent hand washing and to avoid touching eye. She can use a warm compress if needed for pain.   Orders: -     Neomycin-Polymyxin-Dexameth; Place 1-2 drops into the left eye every 6 (six) hours.  Dispense: 5 mL; Refill: 0  Cutaneous candidiasis Assessment & Plan: Area consistent with topical candidiasis. Patient has tried Nystatin powder, encouraged to continue three times daily. Nex Rx sent. Advised to keep area clean and dry. Will add oral antifungal- Diflucan x 1 dose. Return precautions given to patient.   Orders: -     Nystatin; Apply 1 Application topically 3 (three) times daily.  Dispense: 60 g; Refill: 1 -     Fluconazole; Take 1 tablet (150 mg total) by mouth once for 1 dose.  Dispense: 1 tablet; Refill: 0    Return if symptoms worsen or fail to improve.   Bethanie Dicker, NP-C  [facility] - [employer]

## 2023-01-27 ENCOUNTER — Encounter: Payer: Self-pay | Admitting: Nurse Practitioner

## 2023-01-27 ENCOUNTER — Other Ambulatory Visit: Payer: Self-pay | Admitting: Nurse Practitioner

## 2023-01-27 DIAGNOSIS — B372 Candidiasis of skin and nail: Secondary | ICD-10-CM

## 2023-01-27 MED ORDER — FLUCONAZOLE 150 MG PO TABS: ORAL_TABLET | ORAL | 0 refills | Status: DC

## 2023-01-27 NOTE — Assessment & Plan Note (Signed)
Consistent with conjunctivitis. Will treat with Maxitrol. Advised frequent hand washing and to avoid touching eye. She can use a warm compress if needed for pain.

## 2023-01-27 NOTE — Assessment & Plan Note (Addendum)
Area consistent with topical candidiasis. Patient has tried Nystatin powder, encouraged to continue three times daily. Nex Rx sent. Advised to keep area clean and dry. Will add oral antifungal- Diflucan x 1 dose. Return precautions given to patient.

## 2023-02-01 ENCOUNTER — Ambulatory Visit (INDEPENDENT_AMBULATORY_CARE_PROVIDER_SITE_OTHER): Payer: PRIVATE HEALTH INSURANCE | Admitting: Family Medicine

## 2023-02-01 VITALS — BP 116/78 | HR 78 | Temp 98.7°F | Ht 65.0 in | Wt 182.4 lb

## 2023-02-01 DIAGNOSIS — L304 Erythema intertrigo: Secondary | ICD-10-CM

## 2023-02-01 DIAGNOSIS — L081 Erythrasma: Secondary | ICD-10-CM | POA: Diagnosis not present

## 2023-02-01 DIAGNOSIS — L03116 Cellulitis of left lower limb: Secondary | ICD-10-CM | POA: Diagnosis not present

## 2023-02-01 MED ORDER — FLUCONAZOLE 150 MG PO TABS
150.0000 mg | ORAL_TABLET | ORAL | 0 refills | Status: DC
Start: 2023-02-01 — End: 2023-03-03

## 2023-02-01 MED ORDER — CLARITHROMYCIN ER 500 MG PO TB24: 1000.0000 mg | ORAL_TABLET | Freq: Every day | ORAL | 1 refills | Status: AC

## 2023-02-01 MED ORDER — SACCHAROMYCES BOULARDII 250 MG PO CAPS
250.0000 mg | ORAL_CAPSULE | Freq: Every day | ORAL | 0 refills | Status: AC
Start: 2023-02-01 — End: ?

## 2023-02-01 MED ORDER — CEPHALEXIN 500 MG PO CAPS: 500.0000 mg | ORAL_CAPSULE | Freq: Four times a day (QID) | ORAL | 0 refills | Status: AC

## 2023-02-01 NOTE — Progress Notes (Signed)
   SUBJECTIVE:   Chief Complaint  Patient presents with   Insect Bite    Left lower leg, redness and heat.  Noticed it 01/31/23 and has gotten worse.    HPI Patient presents to clinic for acute visit  Bug bite Patient reports thinks she was bitten by a bug yesterday.  Unknown arthropod.  Does not recall insect bite.  Endorses increased redness, swelling and pain at site of puncture.  Denies any fevers, numbness/tingling, weakness.  Redness has progressed.  Intertrigo Recently treated with nystatin powder and Diflucan for candidal rash under breast.  Patient reports worsening symptoms that has spread.  Has been trying to keep area dry.  Not see any difference with dose of Diflucan.  Denies any blisters, fevers or drainage.  PERTINENT PMH / PSH: Obesity  Tobacco use    OBJECTIVE:  BP 116/78   Pulse 78   Temp 98.7 F (37.1 C) (Oral)   Ht 5\' 5"  (1.651 m)   Wt 182 lb 6.4 oz (82.7 kg)   SpO2 95%   BMI 30.35 kg/m    Physical Exam Skin:    Findings: Erythema and rash present.     Comments: Cellulitis to left lower leg consistent with insect bite  Erythrasma intertrigo under bilateral breasts.     ASSESSMENT/PLAN:  Erythrasma Assessment & Plan: Suspect multifactorial. Candida vs Corynebacterium Clarithromycin 1 g x 1 Probiotics x 14 days Follow-up  1 week  Orders: -     Clarithromycin ER; Take 2 tablets (1,000 mg total) by mouth daily.  Dispense: 2 tablet; Refill: 1  Cellulitis of left lower extremity Assessment & Plan: Left lower leg erythema, tender to touch and warmth.  Consistent with cellulitis from recent insect bite Start Keflex 500 mg 4 times daily x 5 days Start probiotics daily and continue for 14 days post treatment Follow-up in 1 week  Orders: -     Cephalexin; Take 1 capsule (500 mg total) by mouth 4 (four) times daily for 5 days.  Dispense: 20 capsule; Refill: 0 -     Saccharomyces boulardii; Take 1 capsule (250 mg total) by mouth daily.  Dispense:  90 capsule; Refill: 0  Intertrigo Assessment & Plan: Suspect multifactorial. Candida vs Corynebacterium Diflucan 150 mg p.o. weekly x 4 weeks Continue nystatin powder, small layer, 4 times daily Keep area dry Follow-up in 1 week  Orders: -     Fluconazole; Take 1 tablet (150 mg total) by mouth once a week.  Dispense: 4 tablet; Refill: 0   PDMP reviewed  Return in about 1 week (around 02/08/2023).  Dana Allan, MD

## 2023-02-01 NOTE — Patient Instructions (Addendum)
It was a pleasure meeting you today. Thank you for allowing me to take part in your health care.  Our goals for today as we discussed include:  Take Biaxin 1 gam once Take Diflucan 150 mg once a week for 4 weeks Take Keflex 500 mg 4 times a day for 5 days  Follow up with PCP in 1 week   If you have any questions or concerns, please do not hesitate to call the office at [phone].  I look forward to our next visit and until then take care and stay safe.  Regards,   Dana Allan, MD   [facility]

## 2023-02-02 ENCOUNTER — Ambulatory Visit: Payer: PRIVATE HEALTH INSURANCE | Admitting: Nurse Practitioner

## 2023-02-06 ENCOUNTER — Encounter: Payer: Self-pay | Admitting: Family Medicine

## 2023-02-06 DIAGNOSIS — L304 Erythema intertrigo: Secondary | ICD-10-CM | POA: Insufficient documentation

## 2023-02-06 DIAGNOSIS — L081 Erythrasma: Secondary | ICD-10-CM | POA: Insufficient documentation

## 2023-02-06 DIAGNOSIS — L03116 Cellulitis of left lower limb: Secondary | ICD-10-CM | POA: Insufficient documentation

## 2023-02-06 NOTE — Assessment & Plan Note (Addendum)
Suspect multifactorial. Candida vs Corynebacterium Clarithromycin 1 g x 1 Probiotics x 14 days Follow-up  1 week

## 2023-02-06 NOTE — Assessment & Plan Note (Addendum)
Left lower leg erythema, tender to touch and warmth.  Consistent with cellulitis from recent insect bite Start Keflex 500 mg 4 times daily x 5 days Start probiotics daily and continue for 14 days post treatment Follow-up in 1 week

## 2023-02-06 NOTE — Assessment & Plan Note (Addendum)
Suspect multifactorial. Candida vs Corynebacterium Diflucan 150 mg p.o. weekly x 4 weeks Continue nystatin powder, small layer, 4 times daily Keep area dry Follow-up in 1 week

## 2023-02-08 NOTE — Progress Notes (Deleted)
   SUBJECTIVE:   No chief complaint on file.  HPI Patient presents to clinic for acute visit  Bug bite Patient reports thinks she was bitten by a bug yesterday.  Unknown arthropod.  Does not recall insect bite.  Endorses increased redness, swelling and pain at site of puncture.  Denies any fevers, numbness/tingling, weakness.  Redness has progressed.  Intertrigo Recently treated with nystatin powder and Diflucan for candidal rash under breast.  Patient reports worsening symptoms that has spread.  Has been trying to keep area dry.  Not see any difference with dose of Diflucan.  Denies any blisters, fevers or drainage.  PERTINENT PMH / PSH: Obesity  Tobacco use    OBJECTIVE:  There were no vitals taken for this visit.   Physical Exam Skin:    Findings: Erythema and rash present.     Comments: Cellulitis to left lower leg consistent with insect bite  Erythrasma intertrigo under bilateral breasts.    ASSESSMENT/PLAN:  There are no diagnoses linked to this encounter.  PDMP reviewed  No follow-ups on file.  Dana Allan, MD

## 2023-02-09 ENCOUNTER — Ambulatory Visit: Payer: PRIVATE HEALTH INSURANCE | Admitting: Family Medicine

## 2023-02-10 ENCOUNTER — Ambulatory Visit: Payer: PRIVATE HEALTH INSURANCE | Admitting: Family Medicine

## 2023-02-14 ENCOUNTER — Ambulatory Visit: Payer: PRIVATE HEALTH INSURANCE | Admitting: Family Medicine

## 2023-02-15 ENCOUNTER — Ambulatory Visit: Payer: PRIVATE HEALTH INSURANCE | Admitting: Family Medicine

## 2023-02-21 ENCOUNTER — Encounter: Payer: Self-pay | Admitting: Family Medicine

## 2023-02-28 ENCOUNTER — Ambulatory Visit: Payer: PRIVATE HEALTH INSURANCE | Admitting: Nurse Practitioner

## 2023-02-28 NOTE — Progress Notes (Signed)
Bethanie Dicker, NP-C Phone: [phone]  Kaleea Mejia is a 47 y.o. female who presents today for rash and sore throat.   Rash- Patient diagnosed with erythrasma and intertrigo in June. It was suspected to be multifactorial, candida vs. Corynebacterium. She was treated with Diflucan 150 mg weekly x 4 weeks and Clarithromycin 1 g x 1 dose. She was advised to continue using her nystatin powder. She completed the Diflucan and did a second dose of Clarithromycin. She continues to have the rash under her bilateral breasts. It has improved some but is not completely resolved. It did resolve completely last week when at the beach but has returned since returning home. The area itches and burns.   She developed a sore throat and right ear pain approximately 2 days ago. She has pain with swallowing. She is fatigued. She denies cough. Denies congestion. Denies fevers.   Social History   Tobacco Use  Smoking Status Former   Types: Cigarettes  Smokeless Tobacco Never  Tobacco Comments   Quit smoking 1 yr     Current Outpatient Medications on File Prior to Visit  Medication Sig Dispense Refill   clarithromycin (BIAXIN XL) 500 MG 24 hr tablet Take 2 tablets (1,000 mg total) by mouth daily. 2 tablet 1   nystatin (MYCOSTATIN/NYSTOP) powder Apply 1 Application topically 3 (three) times daily. 60 g 1   saccharomyces boulardii (FLORASTOR) 250 MG capsule Take 1 capsule (250 mg total) by mouth daily. 90 capsule 0   No current facility-administered medications on file prior to visit.     ROS see history of present illness  Objective  Physical Exam Vitals:   03/01/23 0803  BP: 100/70  Pulse: 86  Temp: 98.3 F (36.8 C)  SpO2: 96%    BP Readings from Last 3 Encounters:  03/01/23 100/70  02/01/23 116/78  01/24/23 116/64   Wt Readings from Last 3 Encounters:  03/01/23 173 lb 9.6 oz (78.7 kg)  02/01/23 182 lb 6.4 oz (82.7 kg)  01/24/23 180 lb 3.2 oz (81.7 kg)    Physical  Exam Constitutional:      General: She is not in acute distress.    Appearance: Normal appearance.  HENT:     Head: Normocephalic.     Right Ear: Tympanic membrane normal.     Left Ear: Tympanic membrane normal.     Mouth/Throat:     Pharynx: Posterior oropharyngeal erythema present.  Cardiovascular:     Rate and Rhythm: Normal rate and regular rhythm.     Heart sounds: Normal heart sounds.  Pulmonary:     Effort: Pulmonary effort is normal.     Breath sounds: Normal breath sounds.  Lymphadenopathy:     Cervical: No cervical adenopathy.  Skin:    General: Skin is warm and dry.     Findings: Rash present.     Comments: Under bilateral breasts- red, raw, scaling  Neurological:     General: No focal deficit present.     Mental Status: She is alert.  Psychiatric:        Mood and Affect: Mood normal.        Behavior: Behavior normal.     Assessment/Plan: Please see individual problem list.  Erythrasma Assessment & Plan: Mild improvement but not resolved. Will trial Miconazole cream. She can also use over the counter benzoyl peroxide washes or antibiotic soaps to keep the area clean and prevent recurrence. Encouraged to keep clean and dry, avoid excess sweating and try to avoid hot and  humid areas. Will refer to Dermatology.  Orders: -     Miconazole Nitrate; Apply 1 Application topically 2 (two) times daily. X 2 weeks  Dispense: 28.35 g; Refill: 1 -     Ambulatory referral to Dermatology  Pharyngitis, unspecified etiology Assessment & Plan: Posterior erythema present. Pain with swallowing. Will treat with Amoxicillin 875 mg BID x 10 days. Encouraged adequate fluid intake and sipping warm fluids. She can use throat lozenges to help with pain. Information provided. Return precautions given to patient.   Orders: -     Amoxicillin; Take 1 tablet (875 mg total) by mouth 2 (two) times daily for 10 days.  Dispense: 20 tablet; Refill: 0  Intertrigo -     Miconazole Nitrate; Apply  1 Application topically 2 (two) times daily. X 2 weeks  Dispense: 28.35 g; Refill: 1 -     Ambulatory referral to Dermatology   Return if symptoms worsen or fail to improve.   Bethanie Dicker, NP-C [facility] - [employer]

## 2023-03-01 ENCOUNTER — Encounter: Payer: Self-pay | Admitting: Nurse Practitioner

## 2023-03-01 ENCOUNTER — Telehealth: Payer: Self-pay

## 2023-03-01 ENCOUNTER — Ambulatory Visit (INDEPENDENT_AMBULATORY_CARE_PROVIDER_SITE_OTHER): Payer: PRIVATE HEALTH INSURANCE | Admitting: Nurse Practitioner

## 2023-03-01 VITALS — BP 100/70 | HR 86 | Temp 98.3°F | Ht 65.0 in | Wt 173.6 lb

## 2023-03-01 DIAGNOSIS — L081 Erythrasma: Secondary | ICD-10-CM | POA: Diagnosis not present

## 2023-03-01 DIAGNOSIS — L304 Erythema intertrigo: Secondary | ICD-10-CM

## 2023-03-01 DIAGNOSIS — J029 Acute pharyngitis, unspecified: Secondary | ICD-10-CM

## 2023-03-01 MED ORDER — MICONAZOLE NITRATE 2 % EX CREA
1.0000 | TOPICAL_CREAM | Freq: Two times a day (BID) | CUTANEOUS | 1 refills | Status: AC
Start: 2023-03-01 — End: ?

## 2023-03-01 MED ORDER — AMOXICILLIN 875 MG PO TABS
875.0000 mg | ORAL_TABLET | Freq: Two times a day (BID) | ORAL | 0 refills | Status: AC
Start: 2023-03-01 — End: 2023-03-11

## 2023-03-01 NOTE — Telephone Encounter (Signed)
Patient states she had an appointment with Bethanie Dicker, NP, this morning and we were supposed to call in medications to her pharmacy.  Patient states she is following-up on this.  I let patient know that amoxicillin (AMOXIL) 875 MG tablet and miconazole (MICOTIN) 2 % cream were both called in to her pharmacy this morning and we received confirmation that they had received the prescriptions.  Patient states she will follow-up with her pharmacy.

## 2023-03-01 NOTE — Telephone Encounter (Signed)
Medications were already sent into pharmacy pt has been notified via mychart

## 2023-03-01 NOTE — Assessment & Plan Note (Addendum)
Mild improvement but not resolved. Will trial Miconazole cream. She can also use over the counter benzoyl peroxide washes or antibiotic soaps to keep the area clean and prevent recurrence. Encouraged to keep clean and dry, avoid excess sweating and try to avoid hot and humid areas. Will refer to Dermatology.

## 2023-03-03 DIAGNOSIS — J029 Acute pharyngitis, unspecified: Secondary | ICD-10-CM | POA: Insufficient documentation

## 2023-03-03 NOTE — Assessment & Plan Note (Signed)
Posterior erythema present. Pain with swallowing. Will treat with Amoxicillin 875 mg BID x 10 days. Encouraged adequate fluid intake and sipping warm fluids. She can use throat lozenges to help with pain. Information provided. Return precautions given to patient.

## 2023-03-31 ENCOUNTER — Encounter (INDEPENDENT_AMBULATORY_CARE_PROVIDER_SITE_OTHER): Payer: Self-pay

## 2023-04-20 ENCOUNTER — Encounter: Payer: PRIVATE HEALTH INSURANCE | Admitting: Family Medicine

## 2024-02-06 ENCOUNTER — Other Ambulatory Visit: Payer: Self-pay

## 2024-02-06 DIAGNOSIS — Z1231 Encounter for screening mammogram for malignant neoplasm of breast: Secondary | ICD-10-CM
